# Patient Record
Sex: Female | Born: 1940
Health system: Southern US, Community
[De-identification: ages and names within clinical notes are randomized; demographics above are authoritative.]

## PROBLEM LIST (undated history)

## (undated) DIAGNOSIS — D649 Anemia, unspecified: Secondary | ICD-10-CM

## (undated) DIAGNOSIS — B019 Varicella without complication: Secondary | ICD-10-CM

## (undated) DIAGNOSIS — T7840XA Allergy, unspecified, initial encounter: Secondary | ICD-10-CM

## (undated) DIAGNOSIS — E079 Disorder of thyroid, unspecified: Secondary | ICD-10-CM

## (undated) DIAGNOSIS — I10 Essential (primary) hypertension: Secondary | ICD-10-CM

## (undated) HISTORY — DX: Disorder of thyroid, unspecified: E07.9

## (undated) HISTORY — DX: Allergy, unspecified, initial encounter: T78.40XA

## (undated) HISTORY — DX: Anemia, unspecified: D64.9

## (undated) HISTORY — DX: Essential (primary) hypertension: I10

## (undated) HISTORY — DX: Varicella without complication: B01.9

---

## 1998-06-08 ENCOUNTER — Emergency Department (HOSPITAL_COMMUNITY): Admission: EM | Admit: 1998-06-08 | Discharge: 1998-06-08 | Payer: Self-pay | Admitting: Emergency Medicine

## 2001-10-10 ENCOUNTER — Encounter: Payer: Self-pay | Admitting: Orthopedic Surgery

## 2001-10-10 ENCOUNTER — Encounter: Payer: Self-pay | Admitting: Emergency Medicine

## 2001-10-10 ENCOUNTER — Ambulatory Visit (HOSPITAL_COMMUNITY): Admission: EM | Admit: 2001-10-10 | Discharge: 2001-10-10 | Payer: Self-pay | Admitting: Emergency Medicine

## 2004-12-27 ENCOUNTER — Ambulatory Visit: Payer: Self-pay | Admitting: Internal Medicine

## 2005-05-02 ENCOUNTER — Ambulatory Visit: Payer: Self-pay | Admitting: Internal Medicine

## 2005-09-16 ENCOUNTER — Ambulatory Visit: Payer: Self-pay | Admitting: Internal Medicine

## 2005-09-21 ENCOUNTER — Ambulatory Visit: Payer: Self-pay | Admitting: Internal Medicine

## 2005-09-29 ENCOUNTER — Ambulatory Visit (HOSPITAL_COMMUNITY): Admission: RE | Admit: 2005-09-29 | Discharge: 2005-09-29 | Payer: Self-pay | Admitting: Internal Medicine

## 2005-10-11 ENCOUNTER — Other Ambulatory Visit: Admission: RE | Admit: 2005-10-11 | Discharge: 2005-10-11 | Payer: Self-pay | Admitting: Interventional Radiology

## 2005-10-11 ENCOUNTER — Encounter: Admission: RE | Admit: 2005-10-11 | Discharge: 2005-10-11 | Payer: Self-pay | Admitting: Internal Medicine

## 2006-08-21 ENCOUNTER — Ambulatory Visit: Payer: Self-pay | Admitting: Internal Medicine

## 2006-10-01 ENCOUNTER — Ambulatory Visit: Payer: Self-pay | Admitting: Internal Medicine

## 2006-10-08 ENCOUNTER — Ambulatory Visit: Payer: Self-pay | Admitting: Internal Medicine

## 2007-03-29 IMAGING — US US SOFT TISSUE HEAD/NECK
1 series · 14 of 25 positions shown · non-contrast
Comparison: none

CLINICAL DATA: Probable nodule left thyroid.
THYROID ULTRASOUND:
TECHNIQUE: Ultrasound examination of the thyroid gland and adjacent soft tissue structures was performed.

[Series 1: unknown · 0.07mm/px · 14 of 28 slices shown]
[im 1/28]
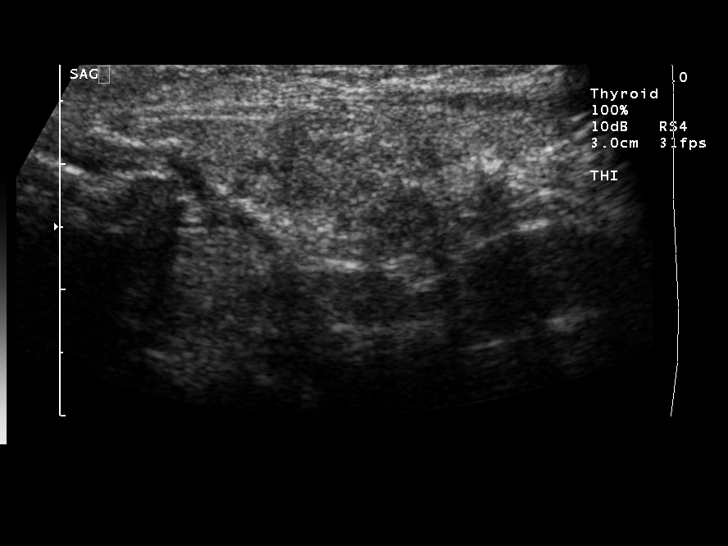
[im 3/28]
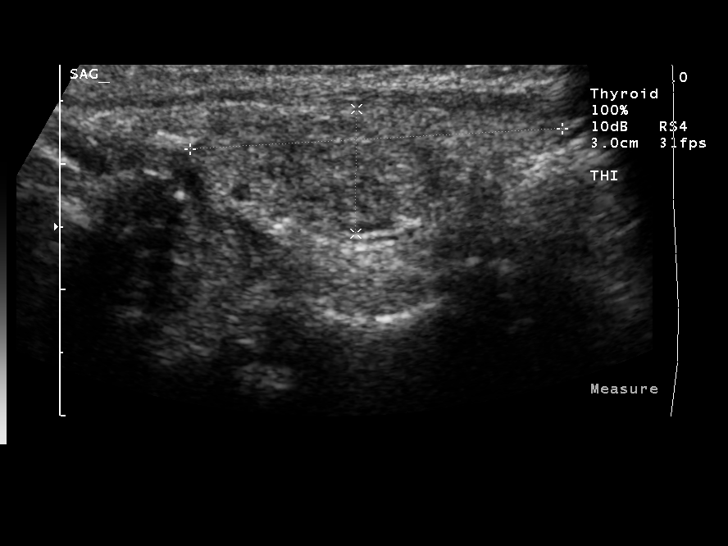
[im 5/28]
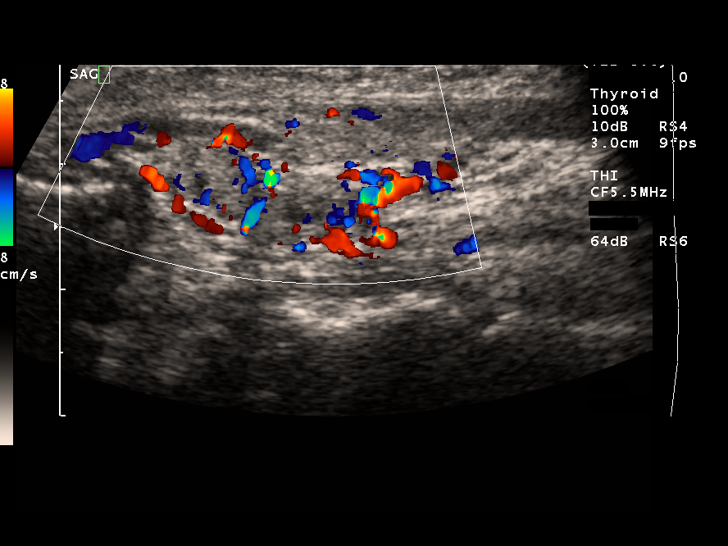
[im 7/28]
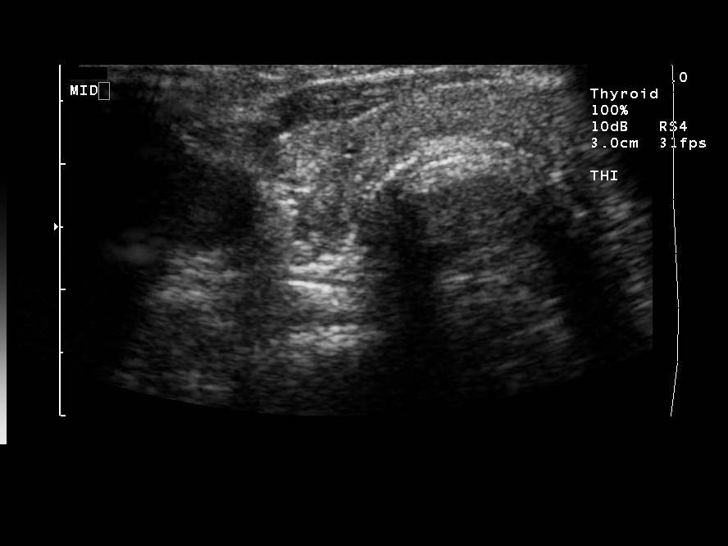
[im 10/28]
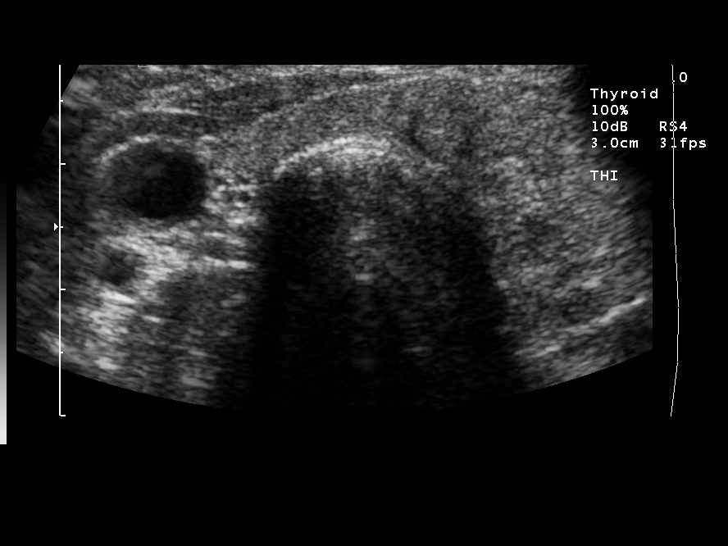
[im 11/28]
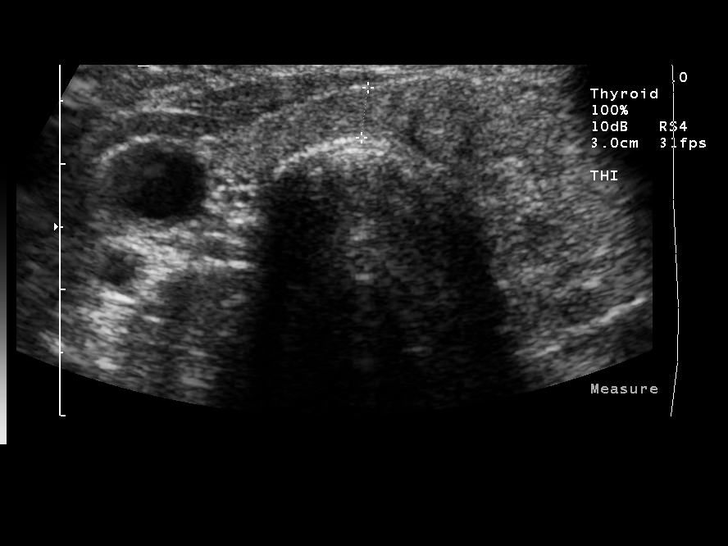
[im 13/28]
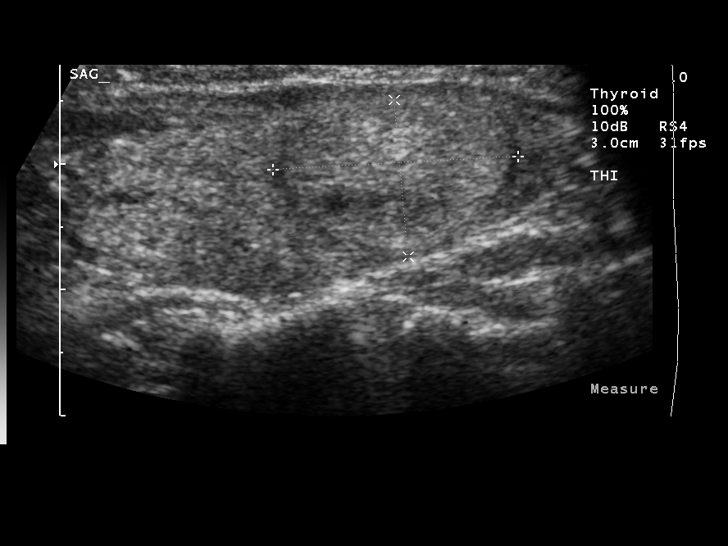
[im 15/28]
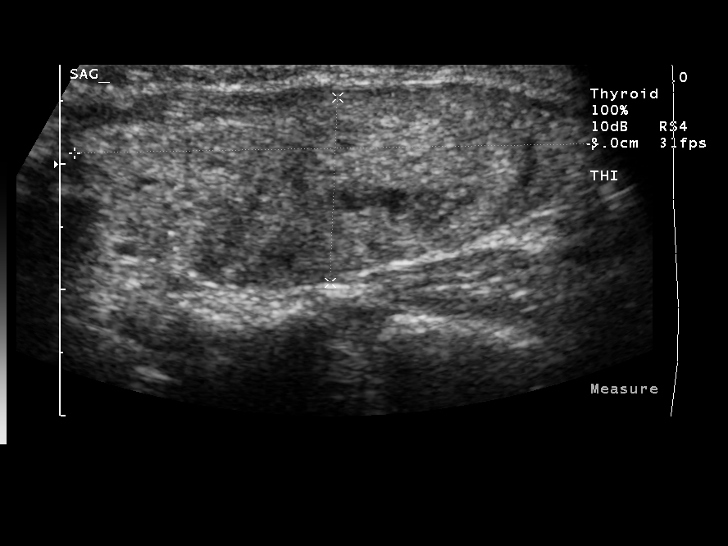
[im 17/28]
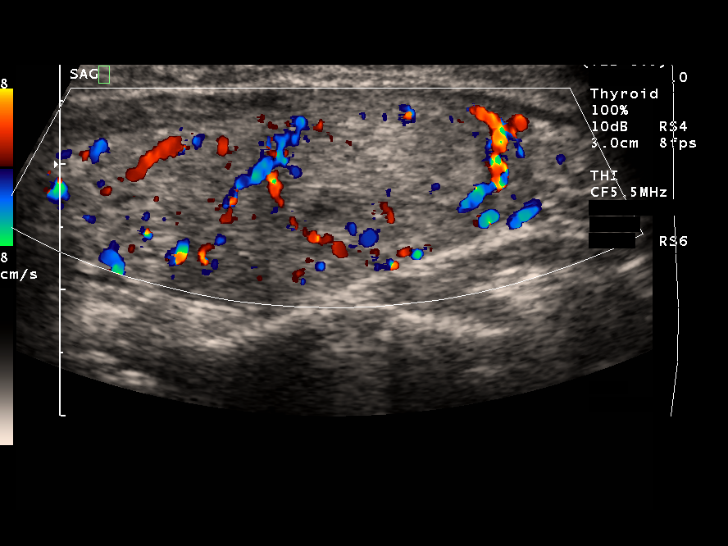
[im 19/28]
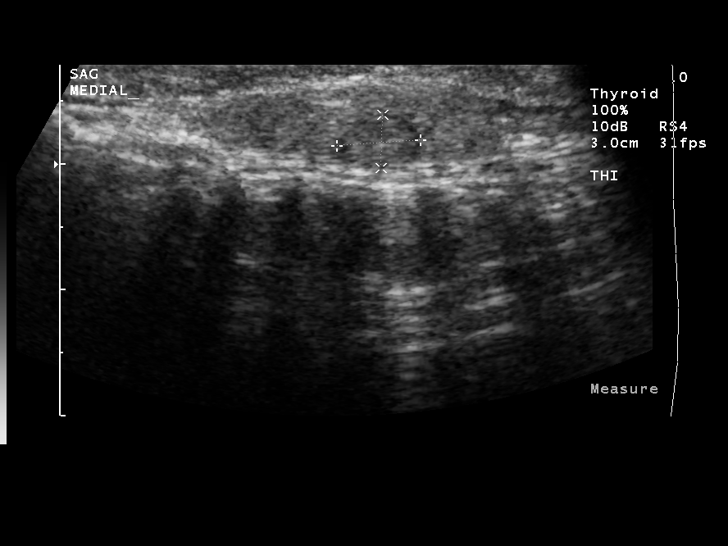
[im 21/28]
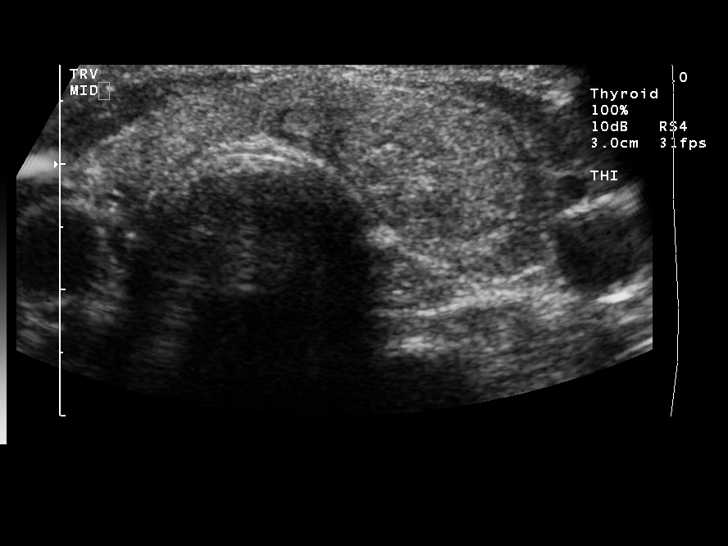
[im 23/28]
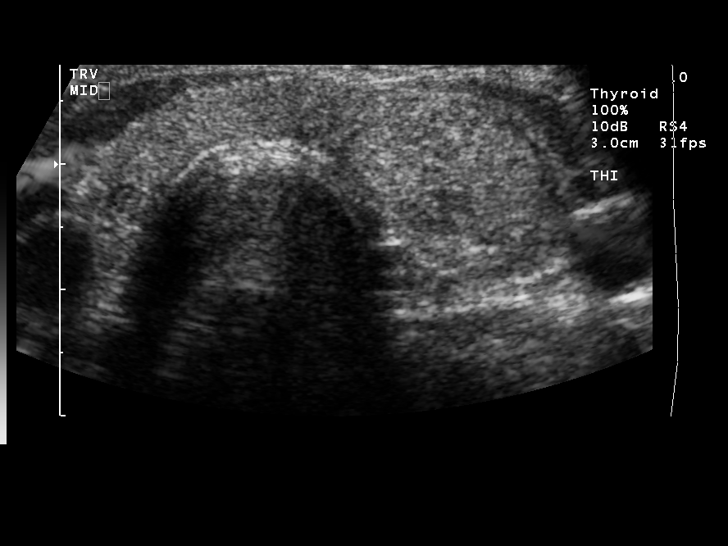
[im 25/28]
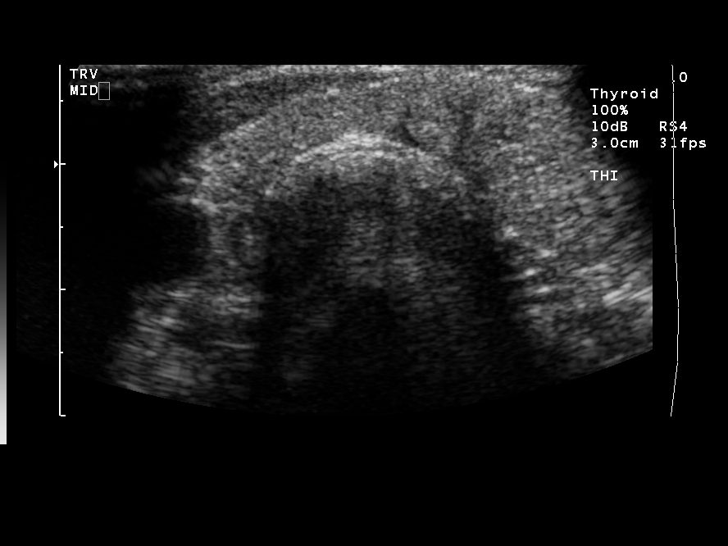
[im 28/28]
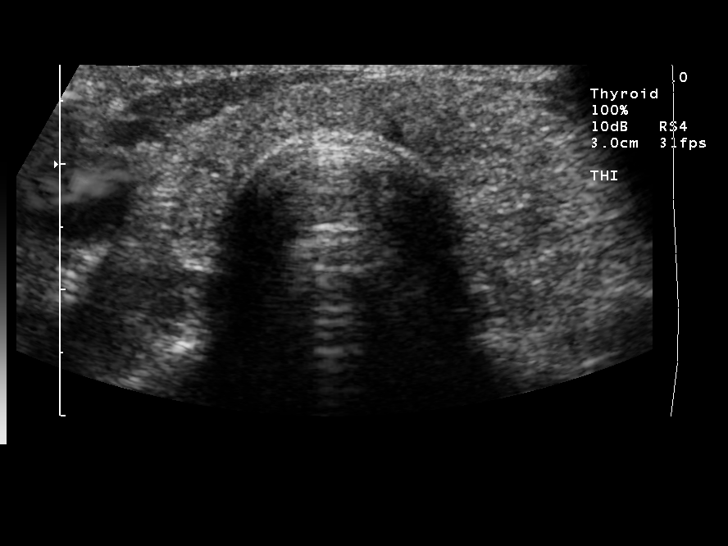

[14 of 25 positions shown; findings below may reference images not displayed]

FINDINGS: Multiple scans of the thyroid show right lobe of the thyroid to be within normal limits and measures 3.0 x 1.0 x 0.9 cm. left lobe measures 4.1 x 1.5 x 1.7 cm and contains a 4 x 5 x 7 mm nodule near the isthmus thyroid. The lower pole of the left lobe shows a 13 x 15 x 20 mm solid nodule.  The isthmus thyroid appears to be within normal limits of normal at 4 mm in thickness.
IMPRESSION: There are two nodules associated with the left thyroid as described above.  The larger is in the lower pole that measures 13 x 15 x 20 cm and appears to be solid.

## 2007-09-16 ENCOUNTER — Ambulatory Visit: Payer: Self-pay | Admitting: Internal Medicine

## 2009-08-03 ENCOUNTER — Encounter (INDEPENDENT_AMBULATORY_CARE_PROVIDER_SITE_OTHER): Payer: Self-pay

## 2009-09-03 ENCOUNTER — Ambulatory Visit: Payer: Self-pay | Admitting: Internal Medicine

## 2010-12-06 NOTE — Miscellaneous (Signed)
Summary: Flu Shot/[facility]  Flu Shot/[facility]   Imported By: Maryln Gottron 04/22/2010 11:06:17  _____________________________________________________________________  External Attachment:    Type:   Image     Comment:   External Document
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
## 2012-06-19 ENCOUNTER — Encounter: Payer: Self-pay | Admitting: Family Medicine

## 2012-06-19 ENCOUNTER — Ambulatory Visit (INDEPENDENT_AMBULATORY_CARE_PROVIDER_SITE_OTHER): Payer: Medicare Other | Admitting: Family Medicine

## 2012-06-19 VITALS — BP 100/74 | Temp 98.1°F | Ht 62.0 in | Wt 117.0 lb

## 2012-06-19 DIAGNOSIS — H9209 Otalgia, unspecified ear: Secondary | ICD-10-CM

## 2012-06-19 DIAGNOSIS — H6120 Impacted cerumen, unspecified ear: Secondary | ICD-10-CM

## 2012-06-19 NOTE — Progress Notes (Signed)
  Subjective:    Patient ID: Alejandra Alexander, female    DOB: July 23, 1941, 71 y.o.   MRN: [id number]  HPI Here for the first time in years complaining of pain in the right ear for 3 days. No other symptoms, no HA or ST or cough. She has tried Tylenol and Sudafed.    Review of Systems  Constitutional: Negative.   HENT: Positive for hearing loss and ear pain. Negative for congestion, postnasal drip, sinus pressure, tinnitus and ear discharge.   Eyes: Negative.   Respiratory: Negative.        Objective:   Physical Exam  Constitutional: She appears well-developed and well-nourished.  HENT:  Head: Normocephalic and atraumatic.  Left Ear: External ear normal.  Nose: Nose normal.  Mouth/Throat: Oropharynx is clear and moist.       Right ear canal is full of cerumen   Eyes: Conjunctivae are normal.  Neck: No thyromegaly present.  Cardiovascular: Normal rate, regular rhythm, normal heart sounds and intact distal pulses.   Pulmonary/Chest: Effort normal and breath sounds normal.  Lymphadenopathy:    She has no cervical adenopathy.          Assessment & Plan:  The cerumen was irrigated clear with water. On re-exam the canal and TM look normal. Recheck prn

## 2012-12-05 ENCOUNTER — Telehealth: Payer: Self-pay | Admitting: Internal Medicine

## 2012-12-05 NOTE — Telephone Encounter (Signed)
Tied to contact pt at both numbers they are wrong. Pt scheduled to come in for Shingles vaccine will let her know then she does not need another Pneumovax injection she is complete.

## 2012-12-05 NOTE — Telephone Encounter (Signed)
Pt would like to know if she is due her pneumonia vac? How long a they good for?

## 2012-12-12 ENCOUNTER — Ambulatory Visit: Payer: Medicare Other | Admitting: *Deleted

## 2012-12-19 ENCOUNTER — Ambulatory Visit: Payer: Medicare Other | Admitting: Internal Medicine

## 2013-09-01 ENCOUNTER — Other Ambulatory Visit: Payer: Self-pay | Admitting: Nurse Practitioner

## 2013-09-01 DIAGNOSIS — E039 Hypothyroidism, unspecified: Secondary | ICD-10-CM

## 2013-09-03 ENCOUNTER — Ambulatory Visit
Admission: RE | Admit: 2013-09-03 | Discharge: 2013-09-03 | Disposition: A | Discharge status: Home / Self Care | Payer: Medicare Other | Source: Ambulatory Visit | Attending: Nurse Practitioner | Admitting: Nurse Practitioner

## 2013-09-03 ENCOUNTER — Other Ambulatory Visit: Payer: Medicare Other

## 2013-09-03 DIAGNOSIS — E039 Hypothyroidism, unspecified: Secondary | ICD-10-CM

## 2013-09-24 ENCOUNTER — Ambulatory Visit: Payer: Medicare Other | Admitting: Endocrinology

## 2013-09-30 ENCOUNTER — Other Ambulatory Visit (HOSPITAL_COMMUNITY)
Admission: RE | Admit: 2013-09-30 | Discharge: 2013-09-30 | Disposition: A | Discharge status: Home / Self Care | Payer: Medicare Other | Source: Ambulatory Visit | Attending: Endocrinology | Admitting: Endocrinology

## 2013-09-30 ENCOUNTER — Ambulatory Visit (INDEPENDENT_AMBULATORY_CARE_PROVIDER_SITE_OTHER): Payer: Medicare Other | Admitting: Endocrinology

## 2013-09-30 ENCOUNTER — Encounter: Payer: Self-pay | Admitting: Endocrinology

## 2013-09-30 VITALS — BP 138/72 | HR 88 | Temp 97.8°F | Resp 16 | Ht 62.0 in | Wt 122.4 lb

## 2013-09-30 DIAGNOSIS — E041 Nontoxic single thyroid nodule: Secondary | ICD-10-CM | POA: Insufficient documentation

## 2013-09-30 DIAGNOSIS — J309 Allergic rhinitis, unspecified: Secondary | ICD-10-CM

## 2013-09-30 DIAGNOSIS — E039 Hypothyroidism, unspecified: Secondary | ICD-10-CM

## 2013-09-30 NOTE — Patient Instructions (Signed)
We'll contact you with biopsy results. If no cancer is seen, Please return in 1 year. Please continue the same thyroid medication.

## 2013-09-30 NOTE — Progress Notes (Signed)
  Subjective:    Patient ID: Alejandra Alexander, female    DOB: 08/30/1942, 72 y.o.   MRN: [id number]  HPI Pt was noted in 2006 to have a slight nodule at the left anterior neck, but no assoc pain.  She has been followed with serial [place] since then.  1 week ago, she was noted to have an elevated TSH, and started taking synthroid.   Past Medical History  Diagnosis Date  . Chickenpox   . Allergy     seasonal  . Hypertension   . Thyroid disease   . Anemia     No past surgical history on file.  History   Social History  . Marital Status: Married    Spouse Name: N/A    Number of Children: N/A  . Years of Education: N/A   Occupational History  . Not on file.   Social History Main Topics  . Smoking status: Never Smoker   . Smokeless tobacco: Never Used  . Alcohol Use: No  . Drug Use: No  . Sexual Activity: Not on file   Other Topics Concern  . Not on file   Social History Narrative  . No narrative on file    No current outpatient prescriptions on file prior to visit.   No current facility-administered medications on file prior to visit.    No Known Allergies  Family History  Problem Relation Age of Onset  . Heart disease Father   . Stroke Father   sister has a goiter BP 138/72  Pulse 88  Temp(Src) 97.8 F (36.6 C) (Oral)  Resp 16  Ht 5\' 2"  (1.575 m)  Wt 122 lb 6.4 oz (55.52 kg)  BMI 22.38 kg/m2  Review of Systems denies depression, hair loss, cramps, sob, weight gain, memory loss, constipation, numbness, blurry vision, myalgias, dry skin, rhinorrhea, easy bruising, and syncope.       Objective:   Physical Exam VS: see vs page GEN: no distress HEAD: head: no deformity eyes: no periorbital swelling, no proptosis external nose and ears are normal.   mouth: no lesion seen NECK: 3 cm left lobe thyroid nodule.   CHEST WALL: no deformity LUNGS:  Clear to auscultation CV: reg rate and rhythm, no murmur ABD: abdomen is soft, nontender.  no hepatosplenomegaly.   not distended.  no hernia MUSCULOSKELETAL: muscle bulk and strength are grossly normal.  no obvious joint swelling.  gait is normal and steady EXTEMITIES: no deformity.  no ulcer on the feet.  feet are of normal color and temp.  no edema PULSES: dorsalis pedis intact bilat.  no carotid bruit NEURO:  cn 2-12 grossly intact.   readily moves all 4's.  sensation is intact to touch on the feet SKIN:  Normal texture and temperature.  No rash or suspicious lesion is visible.   NODES:  None palpable at the neck PSYCH: alert, oriented x3.  Does not appear anxious nor depressed.     outside test results are reviewed: TSH=4.6   Procedure: thyroid needle bx: consent obtained, signed form on chart The area is first sprayed with cooling agent local: marcaine 2%, with epinephrine prep: alcohol pad 3 bxs are done with 27g needles no complications  (i also reviewed [place] report)     Assessment & Plan:  Multinodular goiter, slightly larger since 2006 Hypothyroidism, new, mild. Allergic rhinitis: this can cause sxs at the throat area, but is not thyroid-related.

## 2013-10-02 DIAGNOSIS — E079 Disorder of thyroid, unspecified: Secondary | ICD-10-CM | POA: Insufficient documentation

## 2013-10-02 DIAGNOSIS — J309 Allergic rhinitis, unspecified: Secondary | ICD-10-CM | POA: Insufficient documentation

## 2013-10-02 DIAGNOSIS — J302 Other seasonal allergic rhinitis: Secondary | ICD-10-CM | POA: Insufficient documentation

## 2014-09-08 ENCOUNTER — Encounter: Payer: Self-pay | Admitting: Endocrinology

## 2014-09-08 ENCOUNTER — Ambulatory Visit (INDEPENDENT_AMBULATORY_CARE_PROVIDER_SITE_OTHER): Payer: Medicare Other | Admitting: Endocrinology

## 2014-09-08 VITALS — BP 138/90 | HR 89 | Temp 98.0°F | Ht 62.0 in | Wt 119.0 lb

## 2014-09-08 DIAGNOSIS — E041 Nontoxic single thyroid nodule: Secondary | ICD-10-CM

## 2014-09-08 MED ORDER — LEVOTHYROXINE SODIUM 25 MCG PO TABS
25.0000 ug | ORAL_TABLET | Freq: Every day | ORAL | Status: DC
Start: 2014-09-08 — End: 2015-09-09

## 2014-09-08 NOTE — Progress Notes (Signed)
   Subjective:    Patient ID: Alejandra Alexander, female    DOB: 22-Mar-1941, 73 y.o.   MRN: [id number]  HPI History is from pt and dtr.  pt returns for f/u of multinodular goiter (first noted in 2006; she has been followed with serial US since then; in 2014, she had bx: result was non-neoplasic goiter (bx in 2006 was also benign); also in 2014, she was noted to have an elevated TSH, and started taking synthroid).  pt states she feels well in general.  She does not notice the goiter.  Past Medical History  Diagnosis Date  . Chickenpox   . Allergy     seasonal  . Hypertension   . Thyroid disease   . Anemia     No past surgical history on file.  History   Social History  . Marital Status: Married    Spouse Name: N/A    Number of Children: N/A  . Years of Education: N/A   Occupational History  . Not on file.   Social History Main Topics  . Smoking status: Never Smoker   . Smokeless tobacco: Never Used  . Alcohol Use: No  . Drug Use: No  . Sexual Activity: Not on file   Other Topics Concern  . Not on file   Social History Narrative    Current Outpatient Prescriptions on File Prior to Visit  Medication Sig Dispense Refill  . aspirin 81 MG tablet Take 81 mg by mouth daily.    . calcium-vitamin D (OSCAL WITH D) 500-200 MG-UNIT per tablet Take 1 tablet by mouth daily.    Marland Kitchen. loratadine (CLARITIN) 10 MG tablet Take 10 mg by mouth daily.     No current facility-administered medications on file prior to visit.    No Known Allergies  Family History  Problem Relation Age of Onset  . Heart disease Father   . Stroke Father     BP 138/90 mmHg  Pulse 89  Temp(Src) 98 F (36.7 C) (Oral)  Ht 5\' 2"  (1.575 m)  Wt 119 lb (53.978 kg)  BMI 21.76 kg/m2  SpO2 97%  Review of Systems Denies neck pain.    Objective:   Physical Exam VITAL SIGNS:  See vs page GENERAL: no distress NECK: 3 cm left lobe thyroid nodule.   Skin: not diaphoretic.  Neuro: no tremor.   MSK: gait is  normal and steady. Ext: no edema       Assessment & Plan:  multinodular goiter, clinically stable. Hypothyroidism, due for recheck.  Patient is advised the following: Patient Instructions  You can skip this year on the ultrasound.   Please return in 1 year. Please continue the same thyroid medication.

## 2014-09-08 NOTE — Patient Instructions (Addendum)
You can skip this year on the ultrasound.   Please return in 1 year. Please continue the same thyroid medication.

## 2014-12-07 LAB — HM MAMMOGRAPHY

## 2015-03-03 IMAGING — US US SOFT TISSUE HEAD/NECK
1 series · 13 of 25 positions shown · non-contrast
Comparison: Prior ultrasound report from 09/29/2005. Images not
available.

CLINICAL DATA: Hypothyroidism.

EXAM:
THYROID ULTRASOUND
TECHNIQUE: Ultrasound examination of the thyroid gland and adjacent soft
tissues was performed.

[Series 1: us soft tissue head/neck · 0.05mm/px · 13 of 51 slices shown]
[im 1/51]
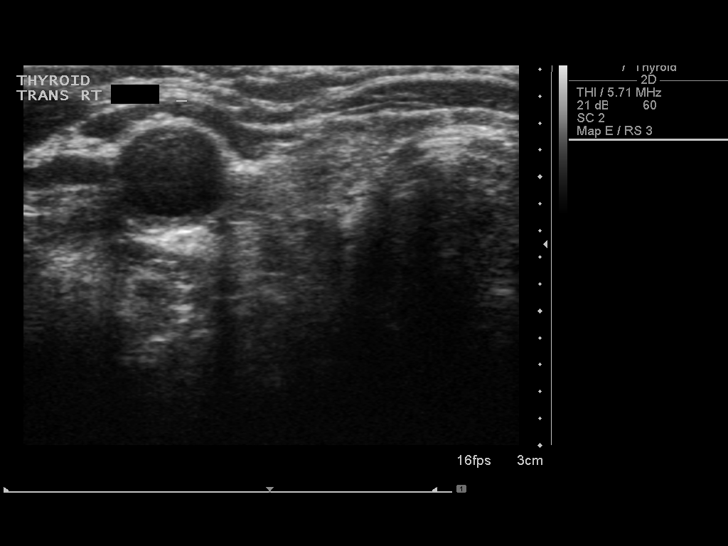
[im 5/51]
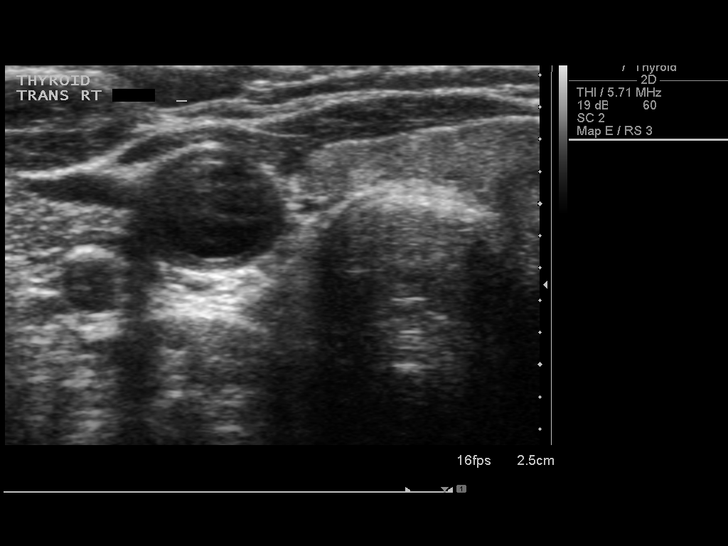
[im 9/51]
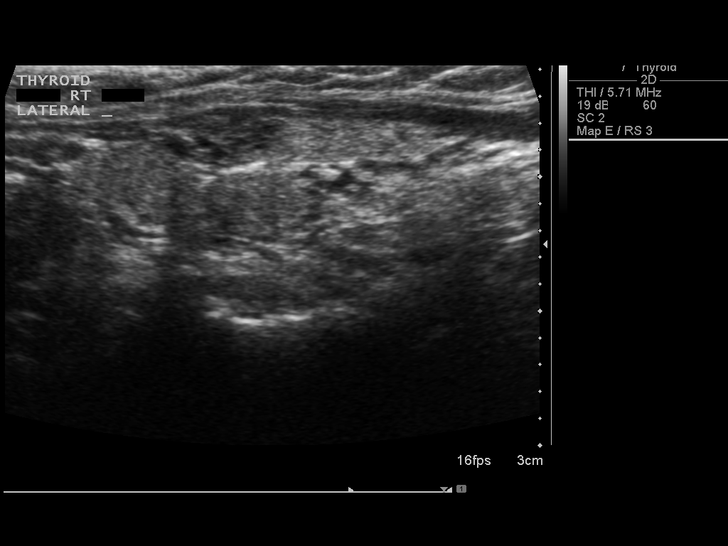
[im 13/51]
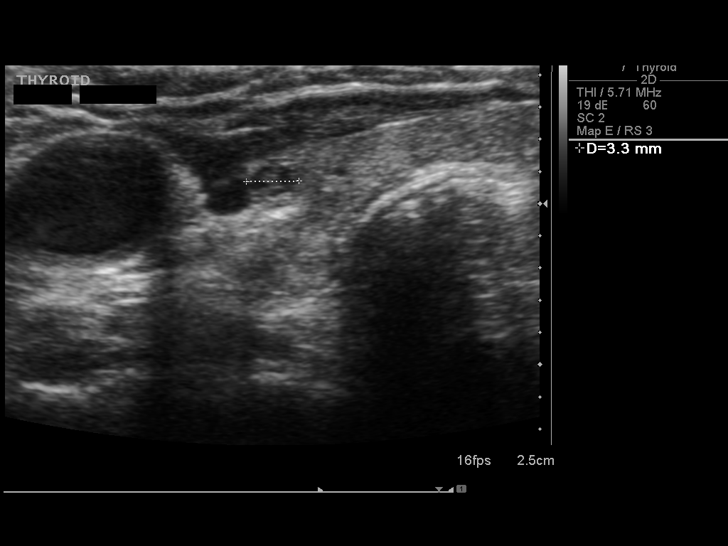
[im 17/51]
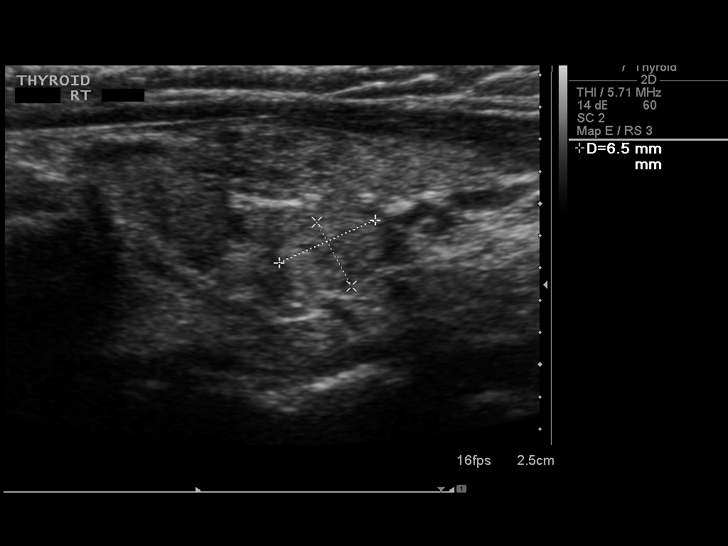
[im 21/51]
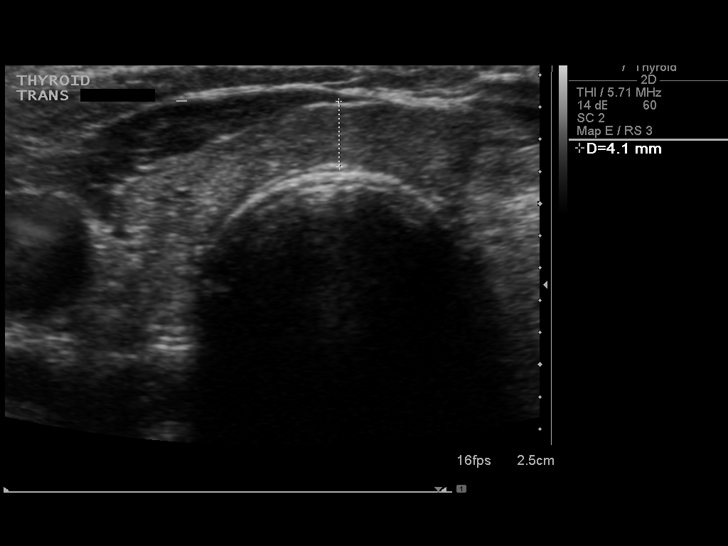
[im 26/51]
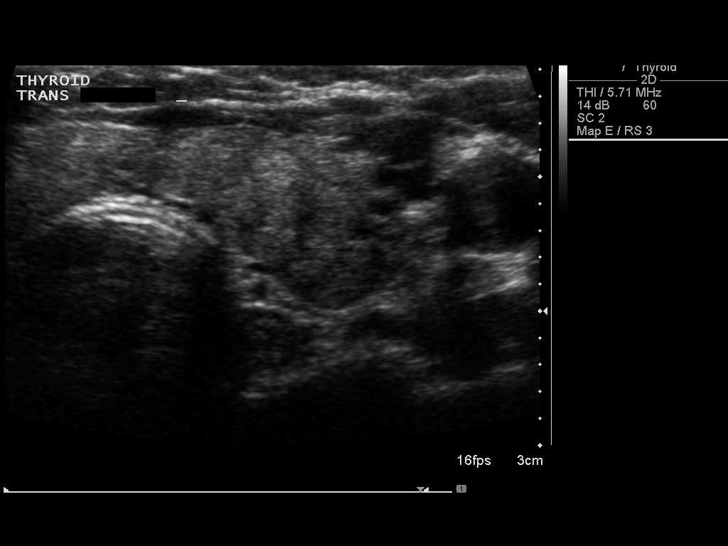
[im 30/51]
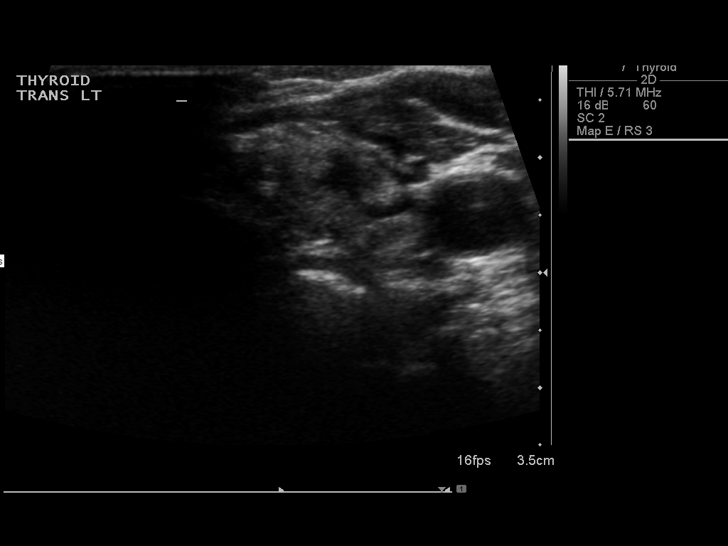
[im 34/51]
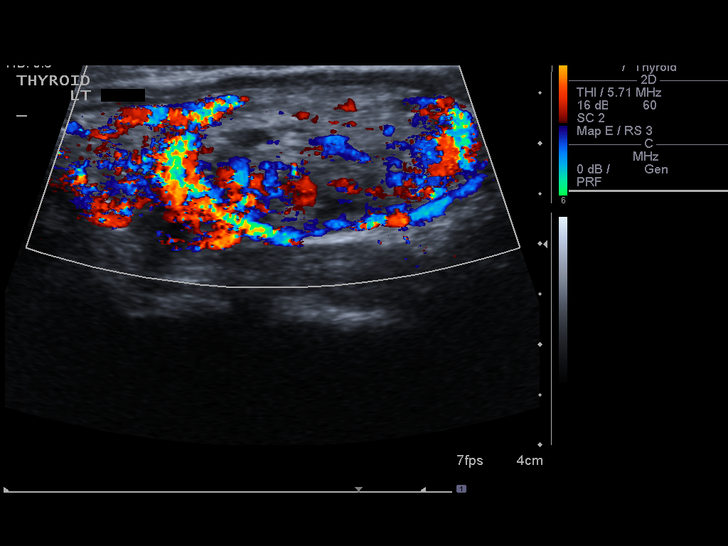
[im 38/51]
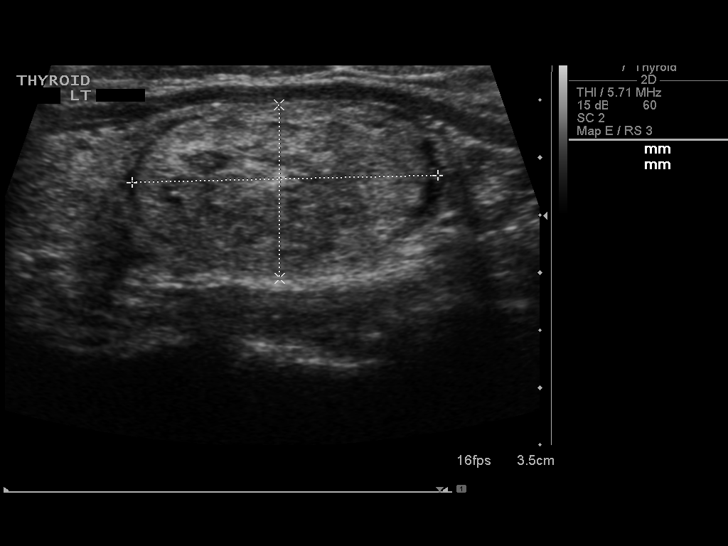
[im 42/51]
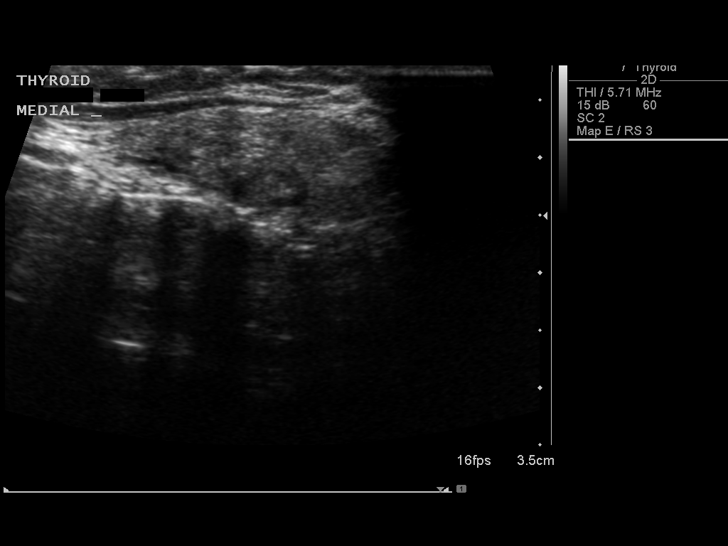
[im 46/51]
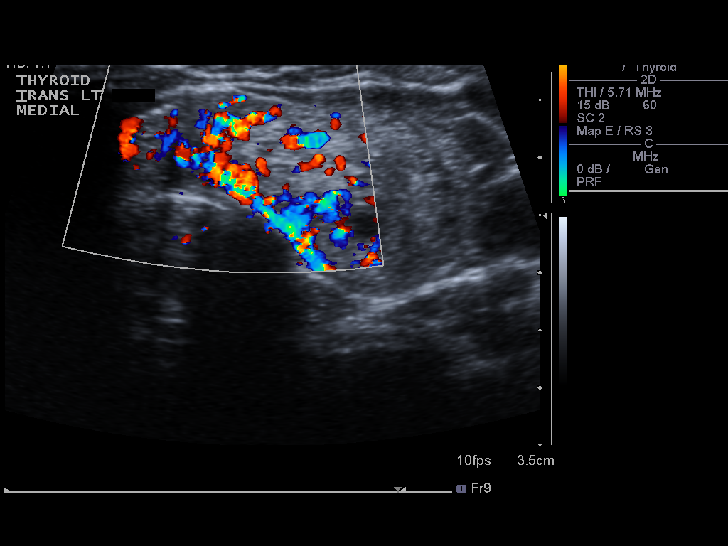
[im 51/51]
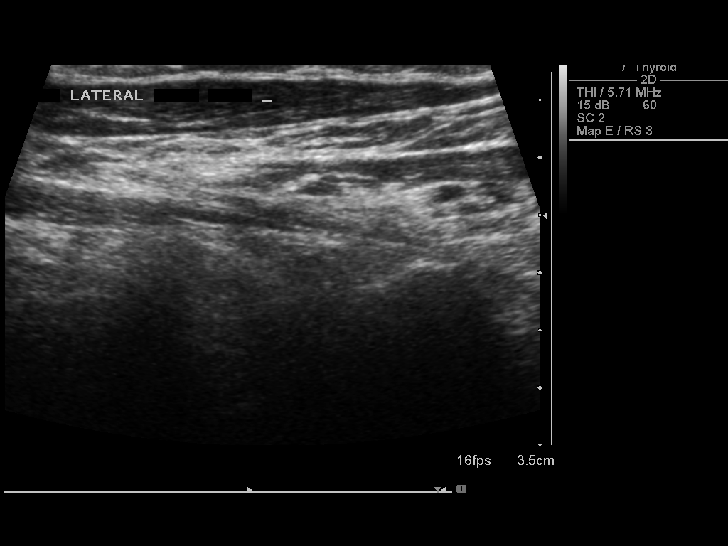

[13 of 25 positions shown; findings below may reference images not displayed]

FINDINGS: Right thyroid lobe

Measurements: 3.0 x 1.2 x 0.9 cm.. The right lower lobe is very
vascular. There is a small complex nodule in the mid aspect that
measures 0.6 x 0.4 x 0.3 cm. There is a slightly hyperechoic solid
nodule in the inferior right thyroid lobe that measures 0.7 x 0.5 x
0.5 cm.

Left thyroid lobe

Measurements: 4.9 x 1.6 x 2.2 cm.. There is a dominant lesion with
peripheral vascularity in the mid and inferior left thyroid lobe.
This dominant nodule measures 2.7 x 1.5 x 2.1 cm. Reportedly, this
dominant nodule previously measured 2.0 x 1.3 x 1.5 cm. There is a
second solid nodule in the left thyroid lobe along the medial
aspect. This nodule measures 0.8 x 0.4 x 0.5 cm and previously
measured 0.7 x 0.4 x 0.5 cm.

Isthmus

Thickness: 0.4 cm.  No nodules visualized.

Lymphadenopathy

None visualized.
IMPRESSION: Bilateral thyroid nodules. The dominant nodule in the left thyroid
lobe has slightly enlarged according to the report from 1886. This
nodule would meet the criteria for ultrasound-guided biopsy. This
recommendation follows the consensus statement: Management of
Thyroid Nodules Detected at US: Society of Radiologists in
Ultrasound Consensus Conference Statement. Radiology 9992;
[DATE].

New small nodules in the right thyroid lobe.

## 2015-05-03 ENCOUNTER — Other Ambulatory Visit: Payer: Self-pay

## 2015-06-09 ENCOUNTER — Encounter: Payer: Self-pay | Admitting: *Deleted

## 2015-09-09 ENCOUNTER — Ambulatory Visit (INDEPENDENT_AMBULATORY_CARE_PROVIDER_SITE_OTHER): Payer: Medicare Other | Admitting: Endocrinology

## 2015-09-09 ENCOUNTER — Encounter: Payer: Self-pay | Admitting: Endocrinology

## 2015-09-09 VITALS — BP 133/86 | HR 76 | Temp 97.9°F | Ht 62.0 in | Wt 109.0 lb

## 2015-09-09 DIAGNOSIS — E041 Nontoxic single thyroid nodule: Secondary | ICD-10-CM | POA: Diagnosis not present

## 2015-09-09 DIAGNOSIS — Z23 Encounter for immunization: Secondary | ICD-10-CM

## 2015-09-09 MED ORDER — LEVOTHYROXINE SODIUM 25 MCG PO TABS: 25.0000 ug | ORAL_TABLET | Freq: Every day | ORAL | Status: DC

## 2015-09-09 NOTE — Progress Notes (Signed)
   Subjective:    Patient ID: Alejandra Alexander, female    DOB: 1941-04-26, 74 y.o.   MRN: [id number]  HPI pt returns for f/u of multinodular goiter (first noted in 2006; she has been followed with serial US since then; in 2014, she had bx: result was non-neoplasic goiter (bx in 2006 was also benign); also in 2014, she was noted to have an elevated TSH, and started taking synthroid).  pt states she feels well in general.  She does not notice the goiter.   Past Medical History  Diagnosis Date  . Chickenpox   . Allergy     seasonal  . Hypertension   . Thyroid disease   . Anemia     No past surgical history on file.  Social History   Social History  . Marital Status: Married    Spouse Name: N/A  . Number of Children: N/A  . Years of Education: N/A   Occupational History  . Not on file.   Social History Main Topics  . Smoking status: Never Smoker   . Smokeless tobacco: Never Used  . Alcohol Use: No  . Drug Use: No  . Sexual Activity: Not on file   Other Topics Concern  . Not on file   Social History Narrative    Current Outpatient Prescriptions on File Prior to Visit  Medication Sig Dispense Refill  . loratadine (CLARITIN) 10 MG tablet Take 10 mg by mouth daily.    Marland Kitchen. aspirin 81 MG tablet Take 81 mg by mouth daily.    . calcium-vitamin D (OSCAL WITH D) 500-200 MG-UNIT per tablet Take 1 tablet by mouth daily.     No current facility-administered medications on file prior to visit.    No Known Allergies  Family History  Problem Relation Age of Onset  . Heart disease Father   . Stroke Father     BP 133/86 mmHg  Pulse 76  Temp(Src) 97.9 F (36.6 C) (Oral)  Ht 5\' 2"  (1.575 m)  Wt 109 lb (49.442 kg)  BMI 19.93 kg/m2  SpO2 98%    Review of Systems Denies dysphagia and sob.      Objective:   Physical Exam VITAL SIGNS:  See vs page GENERAL: no distress NECK: 3 cm left lobe thyroid nodule is again noted.   TSH=1.57    Assessment & Plan:  multinodular  goiter, due for f/u US Hypothyroidism: well-replaced  Patient is advised the following: Patient Instructions  Let's recheck the ultrasound.  you will receive a phone call, about a day and time for an appointment. Please continue the same levothyroxine.   Please return in 2 years.

## 2015-09-09 NOTE — Patient Instructions (Addendum)
Let's recheck the ultrasound.  you will receive a phone call, about a day and time for an appointment. Please continue the same levothyroxine.   Please return in 2 years.

## 2015-09-20 ENCOUNTER — Ambulatory Visit
Admission: RE | Admit: 2015-09-20 | Discharge: 2015-09-20 | Disposition: A | Discharge status: Home / Self Care | Payer: Medicare Other | Source: Ambulatory Visit | Attending: Endocrinology | Admitting: Endocrinology

## 2015-09-20 DIAGNOSIS — E041 Nontoxic single thyroid nodule: Secondary | ICD-10-CM

## 2015-09-21 ENCOUNTER — Telehealth: Payer: Self-pay | Admitting: Endocrinology

## 2015-09-21 NOTE — Telephone Encounter (Signed)
See note below to be advised. 

## 2015-09-21 NOTE — Telephone Encounter (Signed)
I contacted the pt and advise of note below. Requested a call back if the pt would like to discuss.  

## 2015-09-21 NOTE — Telephone Encounter (Signed)
Patient stated that Dr Everardo AllEllison told her to call and let him know if her thyroid test came back normal, she said her thyroid levels are normal.

## 2015-09-21 NOTE — Telephone Encounter (Signed)
please call patient: Your blood test is normal, and US is unchanged--good No further testing or treatment is needed now. Please return in 2 years.

## 2016-09-01 ENCOUNTER — Telehealth: Payer: Self-pay | Admitting: Endocrinology

## 2016-09-01 MED ORDER — LEVOTHYROXINE SODIUM 25 MCG PO TABS: 25.0000 ug | ORAL_TABLET | Freq: Every day | ORAL | 3 refills | Status: DC

## 2016-09-01 NOTE — Telephone Encounter (Signed)
Patient need a refill of levothyroxine (SYNTHROID, LEVOTHROID) 25 MCG tablet  [address] [place] [address] [phone] (Phone) [phone] (Fax)

## 2016-09-01 NOTE — Telephone Encounter (Signed)
Refill submitted per patient's request.  

## 2016-09-08 ENCOUNTER — Ambulatory Visit: Payer: Self-pay | Admitting: Endocrinology

## 2017-03-19 IMAGING — US US SOFT TISSUE HEAD/NECK
1 series · 14 of 25 positions shown · non-contrast
Comparison: 09/03/2013

CLINICAL DATA: Follow-up left thyroid nodule

EXAM:
THYROID ULTRASOUND
TECHNIQUE: Ultrasound examination of the thyroid gland and adjacent soft
tissues was performed.

[Series 1: us soft tissue head/neck · 0.05mm/px · 14 of 57 slices shown]
[im 1/57]
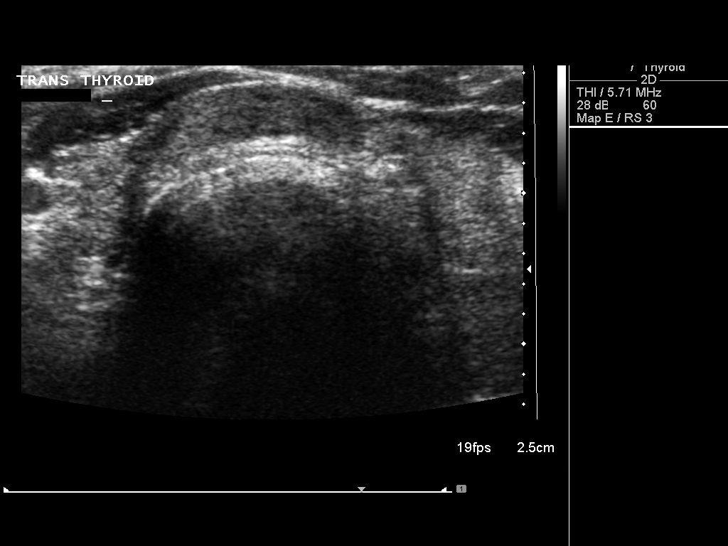
[im 5/57]
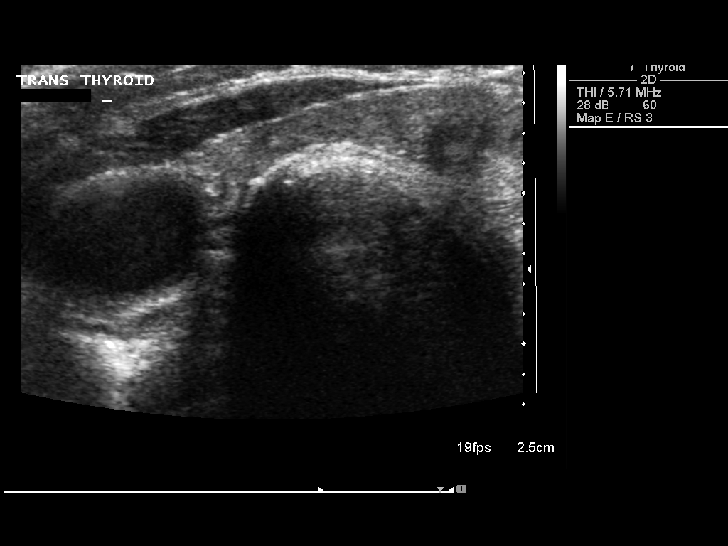
[im 10/57]
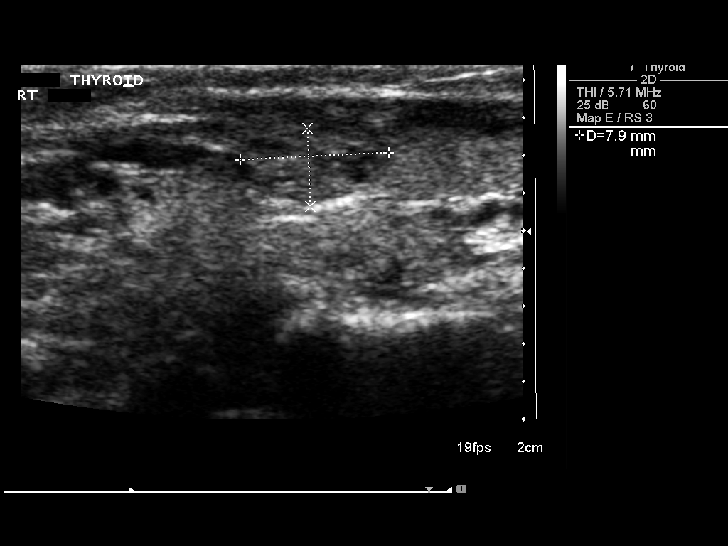
[im 15/57]
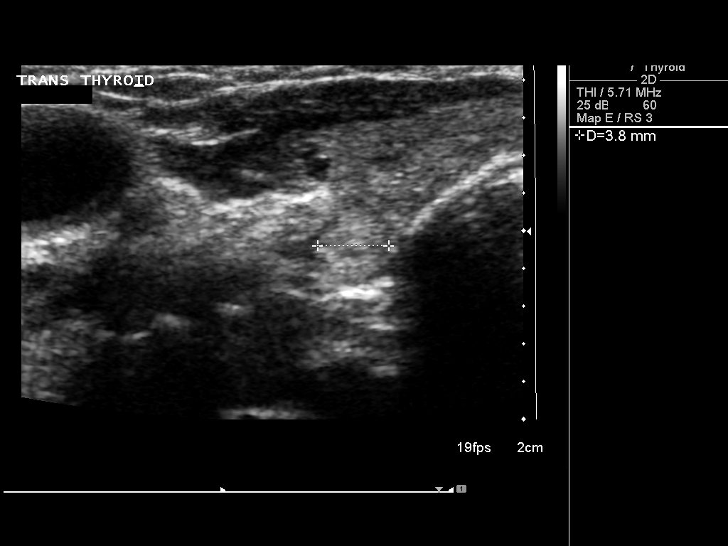
[im 19/57]
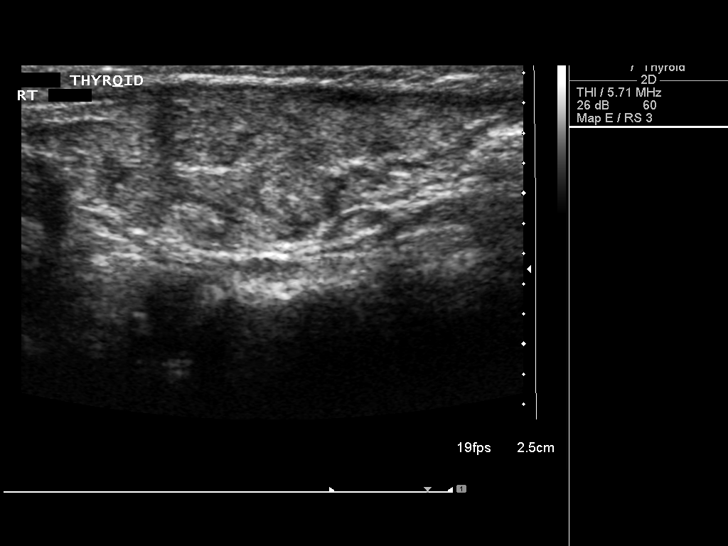
[im 22/57]
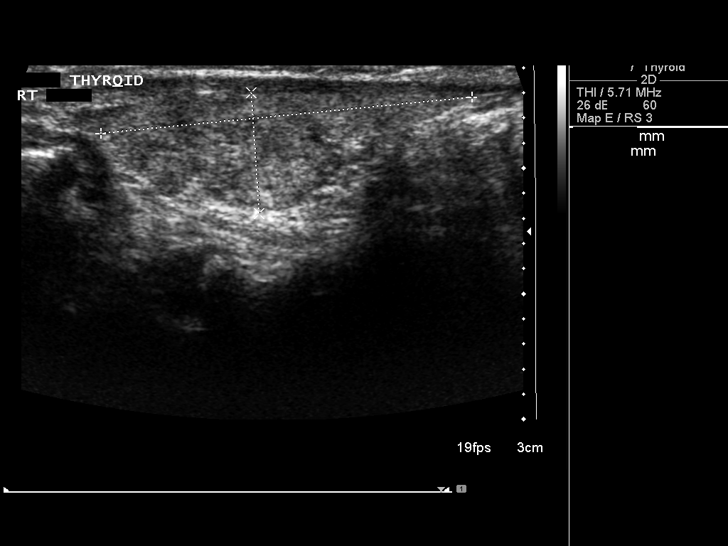
[im 26/57]
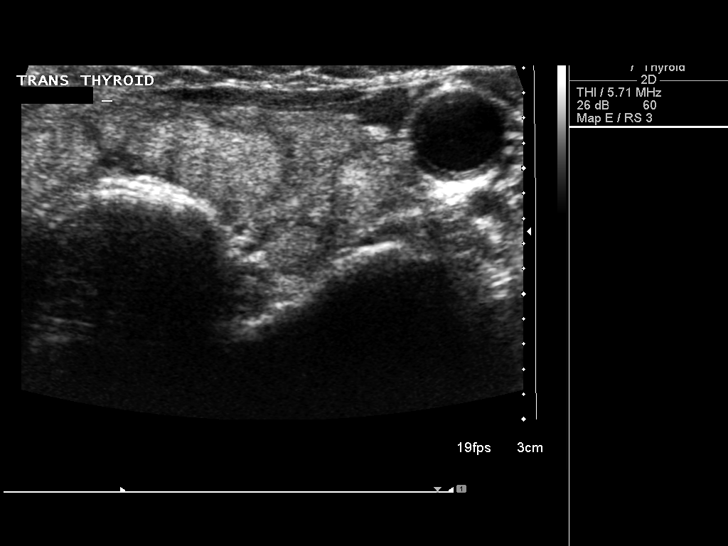
[im 31/57]
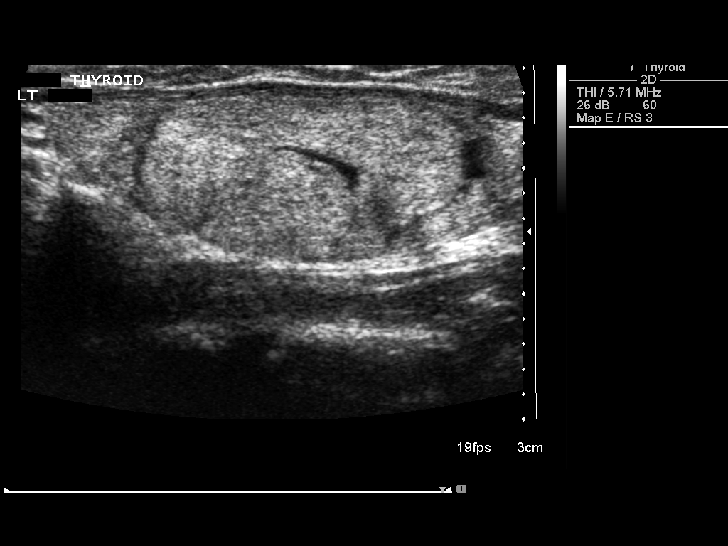
[im 36/57]
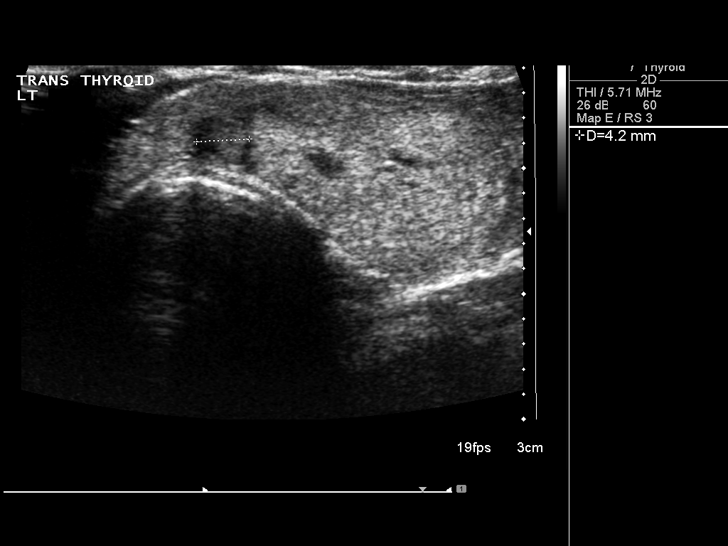
[im 38/57]
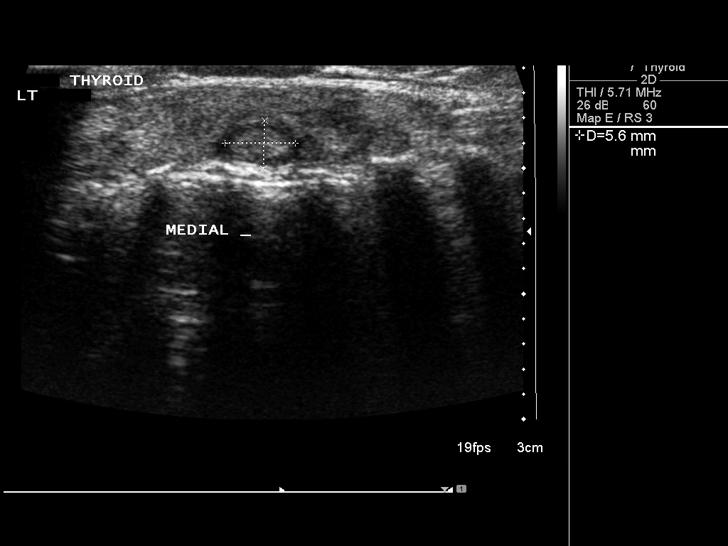
[im 43/57]
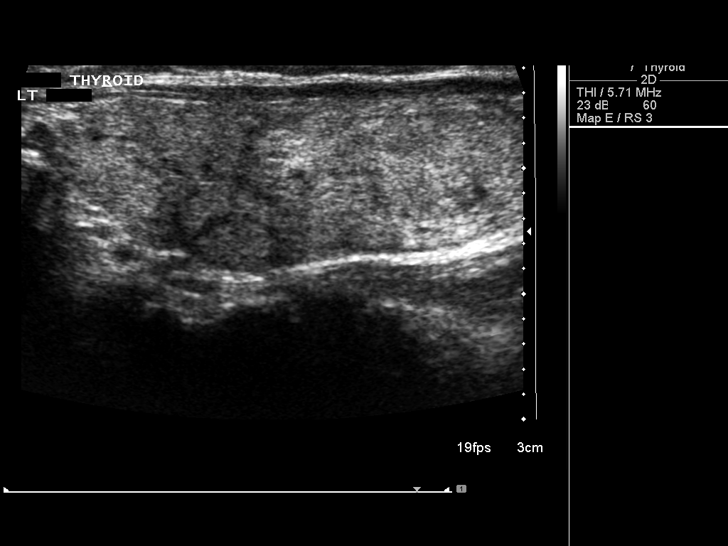
[im 47/57]
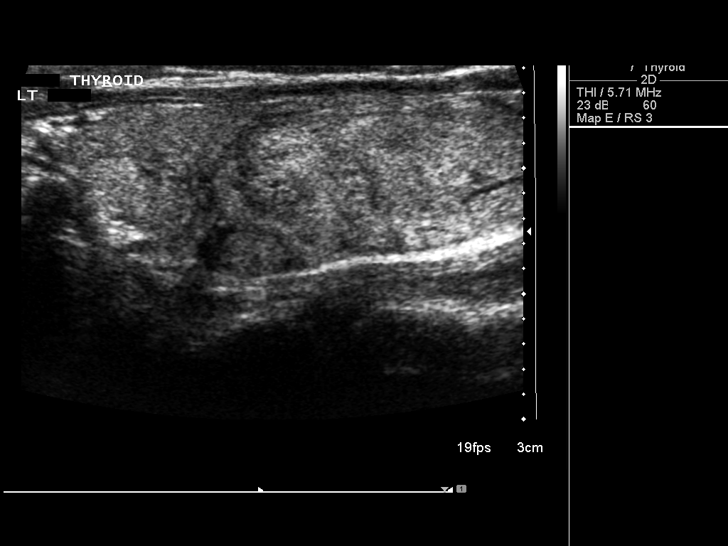
[im 52/57]
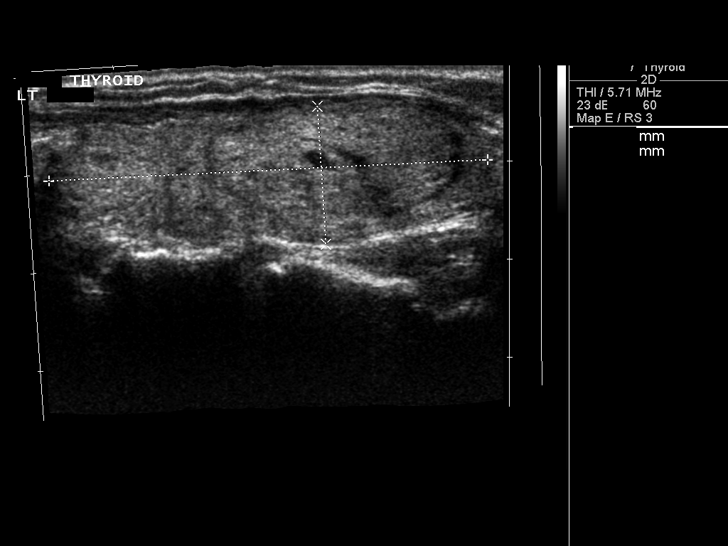
[im 57/57]
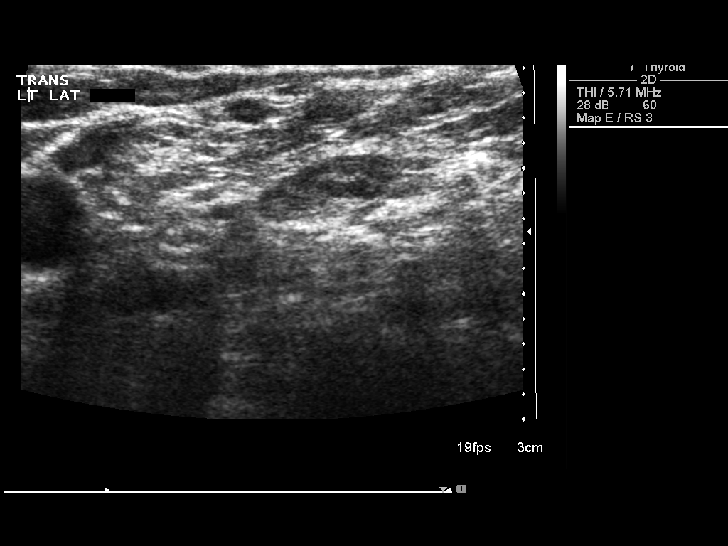

[14 of 25 positions shown; findings below may reference images not displayed]

FINDINGS: Right thyroid lobe

Measurements: 3.0 x 1.0 x 1.2 cm. 8 mm complex but predominately
solid upper pole nodule previously measured 6 mm. 6 mm hyperechoic
lower pole nodule versus pseudo nodule previously measured 7 mm.

Left thyroid lobe

Measurements: 4.5 x 1.4 x 2.0 cm. Dominant predominately solid but
complex nodule in the mid lobe measures 2.6 x 1.3 x 2.1 cm and
previously measured 2.7 x 1.5 x 2.1 cm. Solid 6 mm upper pole
nodule. Medial 6 mm solid nodule previously measured 8 mm.

Isthmus

Thickness: 5 mm.  No nodules visualized.

Lymphadenopathy

None visualized.
IMPRESSION: Bilateral nodules are not significantly changed. The dominant nodule
today measures 2.6 cm and previously measured 2.7 cm.

## 2017-04-04 ENCOUNTER — Telehealth: Payer: Self-pay | Admitting: Endocrinology

## 2017-04-04 NOTE — Telephone Encounter (Signed)
Patient is calling to ask if it is safe to drink unsweetened soy milk with her condition. Verified home #. Ok to leave vm

## 2017-04-04 NOTE — Telephone Encounter (Signed)
Left detailed vm and requested a call back if she had any further questions 

## 2017-04-04 NOTE — Telephone Encounter (Signed)
Yes, fine

## 2017-07-26 ENCOUNTER — Encounter: Payer: Self-pay | Admitting: Internal Medicine

## 2017-08-21 ENCOUNTER — Telehealth: Payer: Self-pay | Admitting: *Deleted

## 2017-08-21 ENCOUNTER — Encounter: Payer: Self-pay | Admitting: Endocrinology

## 2017-08-21 NOTE — Telephone Encounter (Signed)
Patient called with questions regarding the 2nd shingles shot. Please advise

## 2017-08-23 NOTE — Telephone Encounter (Signed)
Called pt, no answer  Will call back at a later time.

## 2017-09-07 ENCOUNTER — Encounter: Payer: Self-pay | Admitting: Endocrinology

## 2017-09-07 ENCOUNTER — Ambulatory Visit (INDEPENDENT_AMBULATORY_CARE_PROVIDER_SITE_OTHER): Payer: Medicare Other | Admitting: Endocrinology

## 2017-09-07 VITALS — BP 142/78 | HR 74 | Wt 116.4 lb

## 2017-09-07 DIAGNOSIS — E041 Nontoxic single thyroid nodule: Secondary | ICD-10-CM

## 2017-09-07 NOTE — Patient Instructions (Addendum)
We can skip the ultrasound this time.  Please continue the same levothyroxine.   Please return in 2 years.

## 2017-09-07 NOTE — Progress Notes (Signed)
   Subjective:    Patient ID: Alejandra Alexander, female    DOB: June 21, 1941, 76 y.o.   MRN: [id number]  HPI pt returns for f/u of multinodular goiter (first noted in 2006; she has been followed with serial US since then; in 2014, she had bx: result was non-neoplasic goiter (bx in 2006 was also benign); also in 2014, she was noted to have an elevated TSH, and started taking synthroid).  pt states she feels well in general.  She does not notice the goiter.  Past Medical History:  Diagnosis Date  . Allergy    seasonal  . Anemia   . Chickenpox   . Hypertension   . Thyroid disease     No past surgical history on file.  Social History   Social History  . Marital status: Widowed    Spouse name: N/A  . Number of children: N/A  . Years of education: N/A   Occupational History  . Not on file.   Social History Main Topics  . Smoking status: Never Smoker  . Smokeless tobacco: Never Used  . Alcohol use No  . Drug use: No  . Sexual activity: Not on file   Other Topics Concern  . Not on file   Social History Narrative  . No narrative on file    Current Outpatient Prescriptions on File Prior to Visit  Medication Sig Dispense Refill  . aspirin 81 MG tablet Take 81 mg by mouth daily.    . calcium-vitamin D (OSCAL WITH D) 500-200 MG-UNIT per tablet Take 1 tablet by mouth daily.    Marland Kitchen. levothyroxine (SYNTHROID, LEVOTHROID) 25 MCG tablet Take 1 tablet (25 mcg total) by mouth daily before breakfast. 90 tablet 3  . loratadine (CLARITIN) 10 MG tablet Take 10 mg by mouth daily.     No current facility-administered medications on file prior to visit.     No Known Allergies  Family History  Problem Relation Age of Onset  . Heart disease Father   . Stroke Father     BP (!) 142/78   Pulse 74   Wt 116 lb 6.4 oz (52.8 kg)   SpO2 98%   BMI 21.29 kg/m    Review of Systems Denies neck pain    Objective:   Physical Exam VITAL SIGNS:  See vs page GENERAL: no distress NECK: 3 cm  left lobe thyroid nodule is again noted.   outside test results are reviewed: TSH=3    Assessment & Plan:  Hypothyroidism: well-replaced.  Multinodular goiter: clinically stable.   Patient Instructions  We can skip the ultrasound this time.  Please continue the same levothyroxine.   Please return in 2 years.

## 2018-02-27 ENCOUNTER — Other Ambulatory Visit: Payer: Self-pay | Admitting: *Deleted

## 2018-02-27 MED ORDER — LEVOTHYROXINE SODIUM 25 MCG PO TABS: 25.0000 ug | ORAL_TABLET | Freq: Every day | ORAL | 3 refills | Status: DC

## 2018-05-16 ENCOUNTER — Encounter: Payer: Self-pay | Admitting: Adult Health

## 2018-05-16 ENCOUNTER — Ambulatory Visit: Payer: Medicare Other | Admitting: Adult Health

## 2018-05-16 VITALS — BP 158/63 | HR 80 | Ht 62.0 in | Wt 123.2 lb

## 2018-05-16 DIAGNOSIS — Z0000 Encounter for general adult medical examination without abnormal findings: Secondary | ICD-10-CM | POA: Diagnosis not present

## 2018-05-16 DIAGNOSIS — I10 Essential (primary) hypertension: Secondary | ICD-10-CM

## 2018-05-16 DIAGNOSIS — E079 Disorder of thyroid, unspecified: Secondary | ICD-10-CM

## 2018-05-16 DIAGNOSIS — J309 Allergic rhinitis, unspecified: Secondary | ICD-10-CM | POA: Diagnosis not present

## 2018-05-16 MED ORDER — MONTELUKAST SODIUM 10 MG PO TABS: 10.0000 mg | ORAL_TABLET | Freq: Every day | ORAL | 3 refills | Status: DC

## 2018-05-16 MED ORDER — LISINOPRIL 5 MG PO TABS: 5.0000 mg | ORAL_TABLET | Freq: Every day | ORAL | 0 refills | Status: DC

## 2018-05-16 MED ORDER — LEVOTHYROXINE SODIUM 25 MCG PO TABS: 25.0000 ug | ORAL_TABLET | Freq: Every day | ORAL | 3 refills | Status: DC

## 2018-05-16 NOTE — Progress Notes (Signed)
Subjective:    Patient ID: Alejandra Alexander, female    DOB: 01-13-1941, 77 y.o.   MRN: [id number]  HPI :  [name] is here to establish as a new pt.  She is a pleasant 77 year old female. PMH:  HTN, Hypothyroidism, Seasonal Allergies, and elevated blood pressure. She reports walking > 4 miles/daily and follows a heart healthy diet. She drinks a combination of water and green tea. She denies tobacco/ETOH use She is widowed and has a large local support system of friends and an adult daughter in [place], [place]  Patient Care Team    Relationship Specialty Notifications Start End  Gordy SaversKwiatkowski, Peter F, MD PCP - General   10/19/10     Patient Active Problem List   Diagnosis Date Noted  . Hypertension 05/16/2018  . Thyroid disease 10/02/2013  . Allergic rhinitis, cause unspecified 10/02/2013  . Left thyroid nodule 09/30/2013     Past Medical History:  Diagnosis Date  . Allergy    seasonal  . Anemia   . Chickenpox   . Hypertension   . Thyroid disease      History reviewed. No pertinent surgical history.   Family History  Problem Relation Age of Onset  . Heart disease Father   . Stroke Father      Social History   Substance and Sexual Activity  Drug Use No     Social History   Substance and Sexual Activity  Alcohol Use No     Social History   Tobacco Use  Smoking Status Never Smoker  Smokeless Tobacco Never Used     Outpatient Encounter Medications as of 05/16/2018  Medication Sig  . levothyroxine (SYNTHROID, LEVOTHROID) 25 MCG tablet Take 1 tablet (25 mcg total) by mouth daily before breakfast.  . [DISCONTINUED] cetirizine (ZYRTEC) 10 MG tablet Take 10 mg by mouth daily.  . [DISCONTINUED] levothyroxine (SYNTHROID, LEVOTHROID) 25 MCG tablet Take 1 tablet (25 mcg total) by mouth daily before breakfast.  . lisinopril (PRINIVIL,ZESTRIL) 5 MG tablet Take 1 tablet (5 mg total) by mouth daily.  . montelukast (SINGULAIR) 10 MG tablet Take 1 tablet (10 mg total)  by mouth at bedtime.  . [DISCONTINUED] aspirin 81 MG tablet Take 81 mg by mouth daily.  . [DISCONTINUED] calcium-vitamin D (OSCAL WITH D) 500-200 MG-UNIT per tablet Take 1 tablet by mouth daily.  . [DISCONTINUED] loratadine (CLARITIN) 10 MG tablet Take 10 mg by mouth daily.   No facility-administered encounter medications on file as of 05/16/2018.     Allergies: Patient has no known allergies.  Body mass index is 22.53 kg/m.  Blood pressure (!) 158/63, pulse 80, height 5\' 2"  (1.575 m), weight 123 lb 3.2 oz (55.9 kg), SpO2 98 %.  Review of Systems  Constitutional: Negative for activity change, appetite change, chills, diaphoresis, fatigue, fever and unexpected weight change.  Eyes: Negative for visual disturbance.  Respiratory: Negative for cough, chest tightness, shortness of breath, wheezing and stridor.   Cardiovascular: Negative for chest pain, palpitations and leg swelling.  Gastrointestinal: Negative for abdominal distention, abdominal pain, blood in stool, constipation, diarrhea, nausea and vomiting.  Endocrine: Negative for cold intolerance, heat intolerance, polydipsia, polyphagia and polyuria.  Genitourinary: Negative for difficulty urinating and flank pain.  Musculoskeletal: Negative for arthralgias, back pain, gait problem, joint swelling, myalgias, neck pain and neck stiffness.  Skin: Negative for color change, pallor, rash and wound.  Neurological: Negative for dizziness and headaches.  Hematological: Does not bruise/bleed easily.  Psychiatric/Behavioral: Negative for  confusion, dysphoric mood, hallucinations, self-injury, sleep disturbance and suicidal ideas. The patient is not nervous/anxious and is not hyperactive.        Objective:   Physical Exam  Constitutional: She is oriented to person, place, and time. She appears well-developed and well-nourished. No distress.  HENT:  Head: Normocephalic and atraumatic.  Right Ear: External ear normal. Decreased hearing is  noted.  Left Ear: External ear normal. Decreased hearing is noted.  Nose: Nose normal.  Mouth/Throat: Oropharynx is clear and moist. Abnormal dentition.  Eyes: Pupils are equal, round, and reactive to light. Conjunctivae and EOM are normal.  Cardiovascular: Normal rate, regular rhythm, normal heart sounds and intact distal pulses.  No murmur heard. Pulmonary/Chest: Effort normal and breath sounds normal. No stridor. No respiratory distress. She has no wheezes. She has no rales. She exhibits no tenderness.  Neurological: She is alert and oriented to person, place, and time.  Skin: Skin is warm and dry. Capillary refill takes less than 2 seconds. No rash noted. She is not diaphoretic. No erythema. No pallor.  Psychiatric: She has a normal mood and affect. Her behavior is normal. Judgment and thought content normal.  Nursing note and vitals reviewed.     Assessment & Plan:   1. Healthcare maintenance   2. Essential hypertension   3. Allergic rhinitis, unspecified seasonality, unspecified trigger   4. Thyroid disease     Hypertension Both BP readings above goal CMP drawn today Started on Lisinopril 5mg  QD F/u 4 weeks  Healthcare maintenance Continue to drink plenty of water and follow a Mediterranean diet. Continue your excellent walking regime- it is GREAT! Stop Cetirizine (Zyrtec) 10mg  and start Montelukast (Singulair) 10mg  once daily. Since your blood pressure readings were both above goal, please start once daily Lisinopril 5mg . We will call you when lab results are available. Follow-up in 4 weeks.  Allergic rhinitis She reports sedation with Cetirizine 10mg  Stop Cetirizine (Zyrtec) 10mg  and start Montelukast (Singulair) 10mg  once daily.  Thyroid disease Currently on Levothyroxine QD    FOLLOW-UP:  Return in about 1 month (around 06/13/2018) for Regular Follow Up, HTN, Evaluate Medication Effectiveness.

## 2018-05-16 NOTE — Assessment & Plan Note (Signed)
Continue to drink plenty of water and follow a Mediterranean diet. Continue your excellent walking regime- it is GREAT! Stop Cetirizine (Zyrtec) 10mg  and start Montelukast (Singulair) 10mg  once daily. Since your blood pressure readings were both above goal, please start once daily Lisinopril 5mg . We will call you when lab results are available. Follow-up in 4 weeks.

## 2018-05-16 NOTE — Assessment & Plan Note (Signed)
She reports sedation with Cetirizine 10mg  Stop Cetirizine (Zyrtec) 10mg  and start Montelukast (Singulair) 10mg  once daily.

## 2018-05-16 NOTE — Assessment & Plan Note (Signed)
Both BP readings above goal CMP drawn today Started on Lisinopril 5mg  QD F/u 4 weeks

## 2018-05-16 NOTE — Assessment & Plan Note (Signed)
Currently on Levothyroxine QD

## 2018-05-16 NOTE — Patient Instructions (Addendum)
Mediterranean Diet A Mediterranean diet refers to food and lifestyle choices that are based on the traditions of countries located on the [place]. This way of eating has been shown to help prevent certain conditions and improve outcomes for people who have chronic diseases, like kidney disease and heart disease. What are tips for following this plan? Lifestyle  Cook and eat meals together with your family, when possible.  Drink enough fluid to keep your urine clear or pale yellow.  Be physically active every day. This includes: ? Aerobic exercise like running or swimming. ? Leisure activities like gardening, walking, or housework.  Get 7-8 hours of sleep each night.  If recommended by your health care provider, drink red wine in moderation. This means 1 glass a day for nonpregnant women and 2 glasses a day for men. A glass of wine equals 5 oz (150 mL). Reading food labels  Check the serving size of packaged foods. For foods such as rice and pasta, the serving size refers to the amount of cooked product, not dry.  Check the total fat in packaged foods. Avoid foods that have saturated fat or trans fats.  Check the ingredients list for added sugars, such as corn syrup. Shopping  At the grocery store, buy most of your food from the areas near the walls of the store. This includes: ? Fresh fruits and vegetables (produce). ? Grains, beans, nuts, and seeds. Some of these may be available in unpackaged forms or large amounts (in bulk). ? Fresh seafood. ? Poultry and eggs. ? Low-fat dairy products.  Buy whole ingredients instead of prepackaged foods.  Buy fresh fruits and vegetables in-season from local farmers markets.  Buy frozen fruits and vegetables in resealable bags.  If you do not have access to quality fresh seafood, buy precooked frozen shrimp or canned fish, such as tuna, salmon, or sardines.  Buy small amounts of raw or cooked vegetables, salads, or olives from the  deli or salad bar at your store.  Stock your pantry so you always have certain foods on hand, such as olive oil, canned tuna, canned tomatoes, rice, pasta, and beans. Cooking  Cook foods with extra-virgin olive oil instead of using butter or other vegetable oils.  Have meat as a side dish, and have vegetables or grains as your main dish. This means having meat in small portions or adding small amounts of meat to foods like pasta or stew.  Use beans or vegetables instead of meat in common dishes like chili or lasagna.  Experiment with different cooking methods. Try roasting or broiling vegetables instead of steaming or sauteing them.  Add frozen vegetables to soups, stews, pasta, or rice.  Add nuts or seeds for added healthy fat at each meal. You can add these to yogurt, salads, or vegetable dishes.  Marinate fish or vegetables using olive oil, lemon juice, garlic, and fresh herbs. Meal planning  Plan to eat 1 vegetarian meal one day each week. Try to work up to 2 vegetarian meals, if possible.  Eat seafood 2 or more times a week.  Have healthy snacks readily available, such as: ? Vegetable sticks with hummus. ? Greek yogurt. ? Fruit and nut trail mix.  Eat balanced meals throughout the week. This includes: ? Fruit: 2-3 servings a day ? Vegetables: 4-5 servings a day ? Low-fat dairy: 2 servings a day ? Fish, poultry, or lean meat: 1 serving a day ? Beans and legumes: 2 or more servings a week ? Nuts   and seeds: 1-2 servings a day ? Whole grains: 6-8 servings a day ? Extra-virgin olive oil: 3-4 servings a day  Limit red meat and sweets to only a few servings a month What are my food choices?  Mediterranean diet ? Recommended ? Grains: Whole-grain pasta. Brown rice. Bulgar wheat. Polenta. Couscous. Whole-wheat bread. [place]. ? Vegetables: Artichokes. Beets. Broccoli. Cabbage. Carrots. Eggplant. Green beans. Chard. Kale. Spinach. Onions. Leeks. Peas. Squash.  Tomatoes. Peppers. Radishes. ? Fruits: Apples. Apricots. Avocado. Berries. Bananas. Cherries. Dates. Figs. Grapes. Lemons. Melon. Oranges. Peaches. Plums. Pomegranate. ? Meats and other protein foods: Beans. Almonds. Sunflower seeds. Pine nuts. Peanuts. [place]. Salmon. Scallops. Shrimp. [place]. Tilapia. Clams. Oysters. Eggs. ? Dairy: Low-fat milk. Cheese. Greek yogurt. ? Beverages: Water. Red wine. Herbal tea. ? Fats and oils: Extra virgin olive oil. Avocado oil. Grape seed oil. ? Sweets and desserts: [place] yogurt with honey. Baked apples. Poached pears. Trail mix. ? Seasoning and other foods: Basil. Cilantro. Coriander. Cumin. Mint. Parsley. Sage. Rosemary. Tarragon. Garlic. Oregano. Thyme. Pepper. Balsalmic vinegar. Tahini. Hummus. Tomato sauce. Olives. Mushrooms. ? Limit these ? Grains: Prepackaged pasta or rice dishes. Prepackaged cereal with added sugar. ? Vegetables: Deep fried potatoes (french fries). ? Fruits: Fruit canned in syrup. ? Meats and other protein foods: Beef. Pork. Lamb. Poultry with skin. Hot dogs. [place] Salines. ? Dairy: Ice cream. Sour cream. Whole milk. ? Beverages: Juice. Sugar-sweetened soft drinks. Beer. Liquor and spirits. ? Fats and oils: Butter. Canola oil. Vegetable oil. Beef fat (tallow). Lard. ? Sweets and desserts: Cookies. Cakes. Pies. Candy. ? Seasoning and other foods: Mayonnaise. Premade sauces and marinades. ? The items listed may not be a complete list. Talk with your dietitian about what dietary choices are right for you. Summary  The Mediterranean diet includes both food and lifestyle choices.  Eat a variety of fresh fruits and vegetables, beans, nuts, seeds, and whole grains.  Limit the amount of red meat and sweets that you eat.  Talk with your health care provider about whether it is safe for you to drink red wine in moderation. This means 1 glass a day for nonpregnant women and 2 glasses a day for men. A glass of wine equals 5 oz (150 mL). This information  is not intended to replace advice given to you by your health care provider. Make sure you discuss any questions you have with your health care provider. Document Released: 06/15/2016 Document Revised: 07/18/2016 Document Reviewed: 06/15/2016 Elsevier Interactive Patient Education  2018 [employer].   Hypertension Hypertension, commonly called high blood pressure, is when the force of blood pumping through the arteries is too strong. The arteries are the blood vessels that carry blood from the heart throughout the body. Hypertension forces the heart to work harder to pump blood and may cause arteries to become narrow or stiff. Having untreated or uncontrolled hypertension can cause heart attacks, strokes, kidney disease, and other problems. A blood pressure reading consists of a higher number over a lower number. Ideally, your blood pressure should be below 120/80. The first ("top") number is called the systolic pressure. It is a measure of the pressure in your arteries as your heart beats. The second ("bottom") number is called the diastolic pressure. It is a measure of the pressure in your arteries as the heart relaxes. What are the causes? The cause of this condition is not known. What increases the risk? Some risk factors for high blood pressure are under your control. Others are not. Factors you can  change  Smoking.  Having type 2 diabetes mellitus, high cholesterol, or both.  Not getting enough exercise or physical activity.  Being overweight.  Having too much fat, sugar, calories, or salt (sodium) in your diet.  Drinking too much alcohol. Factors that are difficult or impossible to change  Having chronic kidney disease.  Having a family history of high blood pressure.  Age. Risk increases with age.  Race. You may be at higher risk if you are African-American.  Gender. Men are at higher risk than women before age 70. After age 39, women are at higher risk than  men.  Having obstructive sleep apnea.  Stress. What are the signs or symptoms? Extremely high blood pressure (hypertensive crisis) may cause:  Headache.  Anxiety.  Shortness of breath.  Nosebleed.  Nausea and vomiting.  Severe chest pain.  Jerky movements you cannot control (seizures).  How is this diagnosed? This condition is diagnosed by measuring your blood pressure while you are seated, with your arm resting on a surface. The cuff of the blood pressure monitor will be placed directly against the skin of your upper arm at the level of your heart. It should be measured at least twice using the same arm. Certain conditions can cause a difference in blood pressure between your right and left arms. Certain factors can cause blood pressure readings to be lower or higher than normal (elevated) for a short period of time:  When your blood pressure is higher when you are in a health care provider's office than when you are at home, this is called white coat hypertension. Most people with this condition do not need medicines.  When your blood pressure is higher at home than when you are in a health care provider's office, this is called masked hypertension. Most people with this condition may need medicines to control blood pressure.  If you have a high blood pressure reading during one visit or you have normal blood pressure with other risk factors:  You may be asked to return on a different day to have your blood pressure checked again.  You may be asked to monitor your blood pressure at home for 1 week or longer.  If you are diagnosed with hypertension, you may have other blood or imaging tests to help your health care provider understand your overall risk for other conditions. How is this treated? This condition is treated by making healthy lifestyle changes, such as eating healthy foods, exercising more, and reducing your alcohol intake. Your health care provider may prescribe  medicine if lifestyle changes are not enough to get your blood pressure under control, and if:  Your systolic blood pressure is above 130.  Your diastolic blood pressure is above 80.  Your personal target blood pressure may vary depending on your medical conditions, your age, and other factors. Follow these instructions at home: Eating and drinking  Eat a diet that is high in fiber and potassium, and low in sodium, added sugar, and fat. An example eating plan is called the DASH (Dietary Approaches to Stop Hypertension) diet. To eat this way: ? Eat plenty of fresh fruits and vegetables. Try to fill half of your plate at each meal with fruits and vegetables. ? Eat whole grains, such as whole wheat pasta, brown rice, or whole grain bread. Fill about one quarter of your plate with whole grains. ? Eat or drink low-fat dairy products, such as skim milk or low-fat yogurt. ? Avoid fatty cuts of meat, processed  or cured meats, and poultry with skin. Fill about one quarter of your plate with lean proteins, such as fish, chicken without skin, beans, eggs, and tofu. ? Avoid premade and processed foods. These tend to be higher in sodium, added sugar, and fat.  Reduce your daily sodium intake. Most people with hypertension should eat less than 1,500 mg of sodium a day.  Limit alcohol intake to no more than 1 drink a day for nonpregnant women and 2 drinks a day for men. One drink equals 12 oz of beer, 5 oz of wine, or 1 oz of hard liquor. Lifestyle  Work with your health care provider to maintain a healthy body weight or to lose weight. Ask what an ideal weight is for you.  Get at least 30 minutes of exercise that causes your heart to beat faster (aerobic exercise) most days of the week. Activities may include walking, swimming, or biking.  Include exercise to strengthen your muscles (resistance exercise), such as pilates or lifting weights, as part of your weekly exercise routine. Try to do these types  of exercises for 30 minutes at least 3 days a week.  Do not use any products that contain nicotine or tobacco, such as cigarettes and e-cigarettes. If you need help quitting, ask your health care provider.  Monitor your blood pressure at home as told by your health care provider.  Keep all follow-up visits as told by your health care provider. This is important. Medicines  Take over-the-counter and prescription medicines only as told by your health care provider. Follow directions carefully. Blood pressure medicines must be taken as prescribed.  Do not skip doses of blood pressure medicine. Doing this puts you at risk for problems and can make the medicine less effective.  Ask your health care provider about side effects or reactions to medicines that you should watch for. Contact a health care provider if:  You think you are having a reaction to a medicine you are taking.  You have headaches that keep coming back (recurring).  You feel dizzy.  You have swelling in your ankles.  You have trouble with your vision. Get help right away if:  You develop a severe headache or confusion.  You have unusual weakness or numbness.  You feel faint.  You have severe pain in your chest or abdomen.  You vomit repeatedly.  You have trouble breathing. Summary  Hypertension is when the force of blood pumping through your arteries is too strong. If this condition is not controlled, it may put you at risk for serious complications.  Your personal target blood pressure may vary depending on your medical conditions, your age, and other factors. For most people, a normal blood pressure is less than 120/80.  Hypertension is treated with lifestyle changes, medicines, or a combination of both. Lifestyle changes include weight loss, eating a healthy, low-sodium diet, exercising more, and limiting alcohol. This information is not intended to replace advice given to you by your health care provider.  Make sure you discuss any questions you have with your health care provider. Document Released: 10/23/2005 Document Revised: 09/20/2016 Document Reviewed: 09/20/2016 Elsevier Interactive Patient Education  2018 [employer].   Continue to drink plenty of water and follow a Mediterranean diet. Continue your excellent walking regime- it is GREAT! Stop Cetirizine (Zyrtec) 10mg  and start Montelukast (Singulair) 10mg  once daily. Since your blood pressure readings were both above goal, please start once daily Lisinopril 5mg . We will call you when lab results are  available. Follow-up in 4 weeks. WELCOME TO THE PRACTICE!

## 2018-05-17 LAB — COMPREHENSIVE METABOLIC PANEL
A/G RATIO: 2 (ref 1.2–2.2)
ALT: 14 IU/L (ref 0–32)
AST: 20 IU/L (ref 0–40)
Albumin: 4.5 g/dL (ref 3.5–4.8)
Alkaline Phosphatase: 85 IU/L (ref 39–117)
BUN / CREAT RATIO: 26 (ref 12–28)
BUN: 21 mg/dL (ref 8–27)
Bilirubin Total: 0.5 mg/dL (ref 0.0–1.2)
CALCIUM: 9.7 mg/dL (ref 8.7–10.3)
CO2: 23 mmol/L (ref 20–29)
Chloride: 106 mmol/L (ref 96–106)
Creatinine, Ser: 0.81 mg/dL (ref 0.57–1.00)
GFR calc Af Amer: 81 mL/min/{1.73_m2} (ref >59)
GFR, EST NON AFRICAN AMERICAN: 70 mL/min/{1.73_m2} (ref >59)
GLOBULIN, TOTAL: 2.2 g/dL (ref 1.5–4.5)
Glucose: 90 mg/dL (ref 65–99)
POTASSIUM: 4.1 mmol/L (ref 3.5–5.2)
SODIUM: 145 mmol/L — AB (ref 134–144)
Total Protein: 6.7 g/dL (ref 6.0–8.5)

## 2018-06-13 ENCOUNTER — Ambulatory Visit (INDEPENDENT_AMBULATORY_CARE_PROVIDER_SITE_OTHER): Payer: Medicare Other | Admitting: Adult Health

## 2018-06-13 ENCOUNTER — Encounter: Payer: Self-pay | Admitting: Adult Health

## 2018-06-13 VITALS — BP 137/58 | HR 79 | Ht 62.0 in | Wt 123.9 lb

## 2018-06-13 DIAGNOSIS — I10 Essential (primary) hypertension: Secondary | ICD-10-CM | POA: Diagnosis not present

## 2018-06-13 MED ORDER — AMLODIPINE BESYLATE 2.5 MG PO TABS: 2.5000 mg | ORAL_TABLET | Freq: Every day | ORAL | 0 refills | Status: DC

## 2018-06-13 NOTE — Progress Notes (Signed)
Subjective:    Patient ID: Alejandra Alexander, female    DOB: [birth year], 77 y.o.   MRN: [id number]  HPI : 05/16/18 OV:  Alejandra Alexander is here to establish as a new pt.  She is a pleasant 77 year old female. PMH:  HTN, Hypothyroidism, Seasonal Allergies, and elevated blood pressure. She reports walking > 4 miles/daily and follows a heart healthy diet. She drinks a combination of water and green tea. She denies tobacco/ETOH use She is widowed and has a large local support system of friends and an adult daughter in [place], [place]  06/13/18 OV: Alejandra Alexander is here for f/u: HTN She was started on Lisinopril 10mg  QD  She stopped Rx after three days due to sig weakness and blurred vision. She was not checking BP/HR during this time She has since increased daily walking, sig reduced Na++ intake and increased garlic in diet She denies CP/dyspnea/dizziness/palpitations  Patient Care Team    Relationship Specialty Notifications Start End  Julaine Fusi, NP PCP - General Family Medicine  05/16/18     Patient Active Problem List   Diagnosis Date Noted  . Hypertension 05/16/2018  . Healthcare maintenance 05/16/2018  . Thyroid disease 10/02/2013  . Allergic rhinitis 10/02/2013  . Left thyroid nodule 09/30/2013     Past Medical History:  Diagnosis Date  . Allergy    seasonal  . Anemia   . Chickenpox   . Hypertension   . Thyroid disease      History reviewed. No pertinent surgical history.   Family History  Problem Relation Age of Onset  . Heart disease Father   . Stroke Father      Social History   Substance and Sexual Activity  Drug Use No     Social History   Substance and Sexual Activity  Alcohol Use No     Social History   Tobacco Use  Smoking Status Never Smoker  Smokeless Tobacco Never Used     Outpatient Encounter Medications as of 06/13/2018  Medication Sig  . levothyroxine (SYNTHROID, LEVOTHROID) 25 MCG tablet Take 1 tablet (25 mcg total) by mouth daily  before breakfast.  . montelukast (SINGULAIR) 10 MG tablet Take 1 tablet (10 mg total) by mouth at bedtime.  Marland Kitchen amLODipine (NORVASC) 2.5 MG tablet Take 1 tablet (2.5 mg total) by mouth daily.  . [DISCONTINUED] lisinopril (PRINIVIL,ZESTRIL) 5 MG tablet Take 1 tablet (5 mg total) by mouth daily.   No facility-administered encounter medications on file as of 06/13/2018.     Allergies: Lisinopril  Body mass index is 22.66 kg/m.  Blood pressure (!) 137/58, pulse 79, height 5\' 2"  (1.575 m), weight 123 lb 14.4 oz (56.2 kg), SpO2 96 %.  Review of Systems  Constitutional: Negative for activity change, appetite change, chills, diaphoresis, fatigue, fever and unexpected weight change.  Eyes: Negative for visual disturbance.  Respiratory: Negative for cough, chest tightness, shortness of breath, wheezing and stridor.   Cardiovascular: Negative for chest pain, palpitations and leg swelling.  Gastrointestinal: Negative for abdominal distention, abdominal pain, blood in stool, constipation, diarrhea, nausea and vomiting.  Endocrine: Negative for cold intolerance, heat intolerance, polydipsia, polyphagia and polyuria.  Genitourinary: Negative for difficulty urinating and flank pain.  Musculoskeletal: Negative for arthralgias, back pain, gait problem, joint swelling, myalgias, neck pain and neck stiffness.  Skin: Negative for color change, pallor, rash and wound.  Neurological: Negative for dizziness and headaches.  Hematological: Does not bruise/bleed easily.  Psychiatric/Behavioral: Negative for confusion, dysphoric mood,  hallucinations, self-injury, sleep disturbance and suicidal ideas. The patient is not nervous/anxious and is not hyperactive.       Objective:   Physical Exam  Constitutional: She is oriented to person, place, and time. She appears well-developed and well-nourished. No distress.  HENT:  Head: Normocephalic and atraumatic.  Right Ear: External ear normal. Decreased hearing is noted.   Left Ear: External ear normal. Decreased hearing is noted.  Nose: Nose normal.  Mouth/Throat: Oropharynx is clear and moist. Abnormal dentition.  Eyes: Pupils are equal, round, and reactive to light. Conjunctivae and EOM are normal.  Cardiovascular: Normal rate, regular rhythm, normal heart sounds and intact distal pulses.  No murmur heard. Pulmonary/Chest: Effort normal and breath sounds normal. No stridor. No respiratory distress. She has no wheezes. She has no rales. She exhibits no tenderness.  Neurological: She is alert and oriented to person, place, and time.  Skin: Skin is warm and dry. Capillary refill takes less than 2 seconds. No rash noted. She is not diaphoretic. No erythema. No pallor.  Psychiatric: She has a normal mood and affect. Her behavior is normal. Judgment and thought content normal.  Nursing note and vitals reviewed.     Assessment & Plan:   1. Essential hypertension     Hypertension BP 170/63, HR 79 Recheck BP 137/58 Stop Lisinopril- now listed as drug allergy Start Amlodipine 2.5mg  once daily Check blood pressure and heart rate daily Call clinic after four days on new medicaiton Follow-up in 2 weeks in office.    FOLLOW-UP:  Return in about 2 weeks (around 06/27/2018) for Regular Follow Up, HTN.

## 2018-06-13 NOTE — Patient Instructions (Signed)

## 2018-06-13 NOTE — Assessment & Plan Note (Addendum)
BP 170/63, HR 79 Recheck BP 137/58 Stop Lisinopril- now listed as drug allergy Start Amlodipine 2.5mg  once daily Check blood pressure and heart rate daily Call clinic after four days on new medicaiton Follow-up in 2 weeks in office.

## 2018-06-19 ENCOUNTER — Telehealth: Payer: Self-pay

## 2018-06-19 NOTE — Telephone Encounter (Signed)
Pt presented to office with BP/HR readings.    06/14/18   179/73    92 06/15/18   151/68  80 06/17/18 146/75  99 06/18/18 139/60  83  Log placed in Katy's inbox. Please advise.  Tiajuana Amass. Maximilliano Kersh, CMA

## 2018-06-23 NOTE — Telephone Encounter (Signed)
Numbers improving Will discuss at her OV this wee Thanks! Orpha BurKaty

## 2018-06-26 NOTE — Progress Notes (Signed)
Subjective:    Patient ID: Alejandra Alexander, female    DOB: October 12, 1941, 77 y.o.   MRN: [id number]  HPI : 05/16/18 OV:  [name] is here to establish as a new pt.  She is a pleasant 77 year old female. PMH:  HTN, Hypothyroidism, Seasonal Allergies, and elevated blood pressure. She reports walking > 4 miles/daily and follows a heart healthy diet. She drinks a combination of water and green tea. She denies tobacco/ETOH use She is widowed and has a large local support system of friends and an adult daughter in [place], [place]  06/13/18 OV: [name] is here for f/u: HTN She was started on Lisinopril 10mg  QD  She stopped Rx after three days due to sig weakness and blurred vision. She was not checking BP/HR during this time She has since increased daily walking, sig reduced Na++ intake and increased garlic in diet She denies CP/dyspnea/dizziness/palpitations  06/26/18 OV: [name] is here for f/u: HTN  She was initially started on Lisinopril 10mg  and experienced weakness/blurred vision. ACE d/c'd, then started on Amlodipine 2.5mg  QD She reports medication compliance, denies SE She denies CP/dyspnea/dizziness/HA She reports home readings- SBPs 130-140 DBPs 70-80 HR 70-80 She continues to walk 4-5 miles/day and follow heart healthy diet  Patient Care Team    Relationship Specialty Notifications Start End  Julaine Fusianford, Nguyen Butler D, NP PCP - General Family Medicine  05/16/18     Patient Active Problem List   Diagnosis Date Noted  . Hypertension 05/16/2018  . Healthcare maintenance 05/16/2018  . Thyroid disease 10/02/2013  . Allergic rhinitis 10/02/2013  . Left thyroid nodule 09/30/2013     Past Medical History:  Diagnosis Date  . Allergy    seasonal  . Anemia   . Chickenpox   . Hypertension   . Thyroid disease      History reviewed. No pertinent surgical history.   Family History  Problem Relation Age of Onset  . Heart disease Father   . Stroke Father      Social History    Substance and Sexual Activity  Drug Use No     Social History   Substance and Sexual Activity  Alcohol Use No     Social History   Tobacco Use  Smoking Status Never Smoker  Smokeless Tobacco Never Used     Outpatient Encounter Medications as of 06/27/2018  Medication Sig  . amLODipine (NORVASC) 2.5 MG tablet Take 1 tablet (2.5 mg total) by mouth daily.  Marland Kitchen. levothyroxine (SYNTHROID, LEVOTHROID) 25 MCG tablet Take 1 tablet (25 mcg total) by mouth daily before breakfast.  . montelukast (SINGULAIR) 10 MG tablet Take 1 tablet (10 mg total) by mouth at bedtime.   No facility-administered encounter medications on file as of 06/27/2018.     Allergies: Lisinopril  Body mass index is 22.64 kg/m.  Blood pressure 134/73, pulse 86, height 5\' 2"  (1.575 m), weight 123 lb 12.8 oz (56.2 kg), SpO2 97 %.  Review of Systems  Constitutional: Negative for activity change, appetite change, chills, diaphoresis, fatigue, fever and unexpected weight change.  Eyes: Negative for visual disturbance.  Respiratory: Negative for cough, chest tightness, shortness of breath, wheezing and stridor.   Cardiovascular: Negative for chest pain, palpitations and leg swelling.  Gastrointestinal: Negative for abdominal distention, abdominal pain, blood in stool, constipation, diarrhea, nausea and vomiting.  Endocrine: Negative for cold intolerance, heat intolerance, polydipsia, polyphagia and polyuria.  Genitourinary: Negative for difficulty urinating and flank pain.  Musculoskeletal: Negative for arthralgias,  back pain, gait problem, joint swelling, myalgias, neck pain and neck stiffness.  Skin: Negative for color change, pallor, rash and wound.  Neurological: Negative for dizziness and headaches.  Hematological: Does not bruise/bleed easily.  Psychiatric/Behavioral: Negative for confusion, dysphoric mood, hallucinations, self-injury, sleep disturbance and suicidal ideas. The patient is not nervous/anxious  and is not hyperactive.       Objective:   Physical Exam  Constitutional: She is oriented to person, place, and time. She appears well-developed and well-nourished. No distress.  HENT:  Head: Normocephalic and atraumatic.  Right Ear: External ear normal. Decreased hearing is noted.  Left Ear: External ear normal. Decreased hearing is noted.  Nose: Nose normal.  Mouth/Throat: Oropharynx is clear and moist. Abnormal dentition.  Eyes: Pupils are equal, round, and reactive to light. Conjunctivae and EOM are normal.  Cardiovascular: Normal rate, regular rhythm, normal heart sounds and intact distal pulses.  No murmur heard. Pulmonary/Chest: Effort normal and breath sounds normal. No stridor. No respiratory distress. She has no wheezes. She has no rales. She exhibits no tenderness.  Neurological: She is alert and oriented to person, place, and time.  Skin: Skin is warm and dry. Capillary refill takes less than 2 seconds. No rash noted. She is not diaphoretic. No erythema. No pallor.  Psychiatric: She has a normal mood and affect. Her behavior is normal. Judgment and thought content normal.  Nursing note and vitals reviewed.     Assessment & Plan:   1. Essential hypertension   2. Healthcare maintenance     Hypertension BP at goal 134/73 Continue Amlodipine 2.5mg  QD Continue to check BP/HR at home Continue excellent walking program and following a heart healthy diet   Healthcare maintenance Your blood pressure is at goal and your are tolerating Amlodipine well- continue on current dosage. Continue to walk daily, follow heart healthy diet, and drink plenty of water. Recommend Medicare Wellness Visit in 6 months.    FOLLOW-UP:  Return in about 6 months (around 12/28/2018).

## 2018-06-27 ENCOUNTER — Ambulatory Visit (INDEPENDENT_AMBULATORY_CARE_PROVIDER_SITE_OTHER): Payer: Medicare Other | Admitting: Adult Health

## 2018-06-27 ENCOUNTER — Encounter: Payer: Self-pay | Admitting: Adult Health

## 2018-06-27 VITALS — BP 134/73 | HR 86 | Ht 62.0 in | Wt 123.8 lb

## 2018-06-27 DIAGNOSIS — I10 Essential (primary) hypertension: Secondary | ICD-10-CM

## 2018-06-27 DIAGNOSIS — Z0000 Encounter for general adult medical examination without abnormal findings: Secondary | ICD-10-CM | POA: Diagnosis not present

## 2018-06-27 NOTE — Patient Instructions (Signed)

## 2018-06-27 NOTE — Assessment & Plan Note (Signed)
Your blood pressure is at goal and your are tolerating Amlodipine well- continue on current dosage. Continue to walk daily, follow heart healthy diet, and drink plenty of water. Recommend Medicare Wellness Visit in 6 months.

## 2018-06-27 NOTE — Assessment & Plan Note (Signed)
BP at goal 134/73 Continue Amlodipine 2.5mg  QD Continue to check BP/HR at home Continue excellent walking program and following a heart healthy diet

## 2018-07-04 ENCOUNTER — Telehealth: Payer: Self-pay

## 2018-07-04 ENCOUNTER — Other Ambulatory Visit: Payer: Self-pay | Admitting: Adult Health

## 2018-07-04 NOTE — Telephone Encounter (Signed)
Good Evening Tonya, Can you please tell [name] to make OV to dicuss. Thanks! Orpha BurKaty

## 2018-07-04 NOTE — Telephone Encounter (Signed)
Pt states that she believes amlodipine is causing her right 2nd and 3rd toe to hurt and swell.  Pt denies any injury, but states that onset of pain was at 2am today.  Please advise.  Tiajuana Amass. Nelson, CMA

## 2018-07-04 NOTE — Telephone Encounter (Signed)
Pt informed.  Pt transferred to front desk to schedule OV.  Tiajuana Amass. Aerilynn Goin, CMA

## 2018-07-09 NOTE — Progress Notes (Signed)
Subjective:    Patient ID: Alejandra Alexander, female    DOB: April 21, 1941, 77 y.o.   MRN: [id number]  HPI : 05/16/18 OV:  Alejandra Alexander is here to establish as a new pt.  She is a pleasant 77 year old female. PMH:  HTN, Hypothyroidism, Seasonal Allergies, and elevated blood pressure. She reports walking > 4 miles/daily and follows a heart healthy diet. She drinks a combination of water and green tea. She denies tobacco/ETOH use She is widowed and has a large local support system of friends and an adult daughter in [place], [place]  06/13/18 OV: Alejandra Alexander is here for f/u: HTN She was started on Lisinopril 10mg  QD  She stopped Rx after three days due to sig weakness and blurred vision. She was not checking BP/HR during this time She has since increased daily walking, sig reduced Na++ intake and increased garlic in diet She denies CP/dyspnea/dizziness/palpitations  06/26/18 OV: Alejandra Alexander is here for f/u: HTN  She was initially started on Lisinopril 10mg  and experienced weakness/blurred vision. ACE d/c'd, then started on Amlodipine 2.5mg  QD She reports medication compliance, denies SE She denies CP/dyspnea/dizziness/HA She reports home readings- SBPs 130-140 DBPs 70-80 HR 70-80 She continues to walk 4-5 miles/day and follow heart healthy diet  07/10/18 OV: Alejandra Alexander presents with R toe pain and edema that started 06/30/18, she has been on amlodipine 2.5mg  QD since 06/26/18 She reports pain is intermittent, described as "plainly hurting", rated 3/10 and will increase with standing/walking.  She has been unable to walk due to pain. She continues to eat a heart healthy diet and denies tobacco/ETOH use  She denies CP/dyspnea/dizziness/HA/palpitations. She reports home readings: SBP 150-160s, DBP 80s, HR 60-70s  Patient Care Team    Relationship Specialty Notifications Start End  Danford, Jinny Blossom, NP PCP - General Family Medicine  05/16/18     Patient Active Problem List   Diagnosis Date Noted  .  Hypertension 05/16/2018  . Healthcare maintenance 05/16/2018  . Thyroid disease 10/02/2013  . Allergic rhinitis 10/02/2013  . Left thyroid nodule 09/30/2013     Past Medical History:  Diagnosis Date  . Allergy    seasonal  . Anemia   . Chickenpox   . Hypertension   . Thyroid disease      History reviewed. No pertinent surgical history.   Family History  Problem Relation Age of Onset  . Heart disease Father   . Stroke Father      Social History   Substance and Sexual Activity  Drug Use No     Social History   Substance and Sexual Activity  Alcohol Use No     Social History   Tobacco Use  Smoking Status Never Smoker  Smokeless Tobacco Never Used     Outpatient Encounter Medications as of 07/10/2018  Medication Sig  . levothyroxine (SYNTHROID, LEVOTHROID) 25 MCG tablet Take 1 tablet (25 mcg total) by mouth daily before breakfast.  . losartan (COZAAR) 25 MG tablet Take 1 tablet (25 mg total) by mouth daily.  . [DISCONTINUED] montelukast (SINGULAIR) 10 MG tablet Take 1 tablet (10 mg total) by mouth at bedtime.   No facility-administered encounter medications on file as of 07/10/2018.     Allergies: Lisinopril  Body mass index is 22.17 kg/m.  Blood pressure (!) 172/65, pulse 70, height 5\' 2"  (1.575 m), weight 121 lb 3.2 oz (55 kg), SpO2 98 %.  Review of Systems  Constitutional: Negative for activity change, appetite change, chills, diaphoresis,  fatigue, fever and unexpected weight change.  Eyes: Negative for visual disturbance.  Respiratory: Negative for cough, chest tightness, shortness of breath, wheezing and stridor.   Cardiovascular: Negative for chest pain, palpitations and leg swelling.  Gastrointestinal: Negative for abdominal distention, abdominal pain, blood in stool, constipation, diarrhea, nausea and vomiting.  Endocrine: Negative for cold intolerance, heat intolerance, polydipsia, polyphagia and polyuria.  Genitourinary: Negative for  difficulty urinating and flank pain.  Musculoskeletal: Negative for arthralgias, back pain, gait problem, joint swelling, myalgias, neck pain and neck stiffness.  Skin: Negative for color change, pallor, rash and wound.  Neurological: Negative for dizziness and headaches.  Hematological: Does not bruise/bleed easily.  Psychiatric/Behavioral: Negative for confusion, dysphoric mood, hallucinations, self-injury, sleep disturbance and suicidal ideas. The patient is not nervous/anxious and is not hyperactive.        Objective:   Physical Exam  Constitutional: She is oriented to person, place, and time. She appears well-developed and well-nourished. No distress.  HENT:  Head: Normocephalic and atraumatic.  Right Ear: External ear normal. Decreased hearing is noted.  Left Ear: External ear normal. Decreased hearing is noted.  Nose: Nose normal.  Mouth/Throat: Oropharynx is clear and moist. Abnormal dentition.  Eyes: Pupils are equal, round, and reactive to light. Conjunctivae and EOM are normal.  Cardiovascular: Normal rate, regular rhythm, normal heart sounds and intact distal pulses.  No murmur heard. Pulmonary/Chest: Effort normal and breath sounds normal. No stridor. No respiratory distress. She has no wheezes. She has no rales. She exhibits no tenderness.  Neurological: She is alert and oriented to person, place, and time.  Skin: Skin is warm and dry. Capillary refill takes less than 2 seconds. No rash noted. She is not diaphoretic. No erythema. No pallor.  Psychiatric: She has a normal mood and affect. Her behavior is normal. Judgment and thought content normal.  Nursing note and vitals reviewed.     Assessment & Plan:   1. Essential hypertension     Hypertension BPs well above goal 182/68, 172/65 HR 70 Home readings- SBP 150-160s, DBP 80s HR 60-70s Lisinopril 10mg  QD caused weakness/blurred vision, therefore d/c'd Amlodipine 2.5mg  QD caused R foot edema/pain, therefore  d/c'd Start Losartan 25mg  once daily. Continue healthy eating and resume regular walking as tolerated. Please check your blood pressure and heart rate daily and bring log to follow-up in 2 weeks (log provided). Discussed at length the uncontrolled HTN increases risk of MI, CVA, and renal damage If are unable to tolerate Losartan 25mg  once daily, we will refer you to hypertensive clinic.  Pt was in the office today for 25+ minutes, I spent >75% of time in face to face counseling of patient's various medical conditions and in coordination of care  FOLLOW-UP:  Return in about 2 weeks (around 07/24/2018) for HTN.

## 2018-07-10 ENCOUNTER — Encounter: Payer: Self-pay | Admitting: Adult Health

## 2018-07-10 ENCOUNTER — Ambulatory Visit (INDEPENDENT_AMBULATORY_CARE_PROVIDER_SITE_OTHER): Payer: Medicare Other | Admitting: Adult Health

## 2018-07-10 VITALS — BP 172/65 | HR 70 | Ht 62.0 in | Wt 121.2 lb

## 2018-07-10 DIAGNOSIS — I10 Essential (primary) hypertension: Secondary | ICD-10-CM | POA: Diagnosis not present

## 2018-07-10 MED ORDER — LOSARTAN POTASSIUM 25 MG PO TABS: 25.0000 mg | ORAL_TABLET | Freq: Every day | ORAL | 0 refills | Status: DC

## 2018-07-10 NOTE — Patient Instructions (Signed)

## 2018-07-10 NOTE — Assessment & Plan Note (Addendum)
BPs well above goal 182/68, 172/65 HR 70 Home readings- SBP 150-160s, DBP 80s HR 60-70s Lisinopril 10mg  QD caused weakness/blurred vision, therefore d/c'd Amlodipine 2.5mg  QD caused R foot edema/pain, therefore d/c'd Start Losartan 25mg  once daily. Continue healthy eating and resume regular walking as tolerated. Please check your blood pressure and heart rate daily and bring log to follow-up in 2 weeks (log provided). Discussed at length the uncontrolled HTN increases risk of MI, CVA, and renal damage If are unable to tolerate Losartan 25mg  once daily, we will refer you to hypertensive clinic.

## 2018-07-15 ENCOUNTER — Telehealth: Payer: Self-pay | Admitting: Adult Health

## 2018-07-15 MED ORDER — LOSARTAN POTASSIUM 25 MG PO TABS: 25.0000 mg | ORAL_TABLET | Freq: Every day | ORAL | 0 refills | Status: DC

## 2018-07-15 NOTE — Telephone Encounter (Signed)
Patient states that she left her BP meds while visiting her sister in [place], she is hoping for a new prescription to be sent to [facility]. Please advise

## 2018-07-15 NOTE — Addendum Note (Signed)
Addended by: Stan Head on: 07/15/2018 03:33 PM   Modules accepted: Orders

## 2018-07-24 ENCOUNTER — Encounter: Payer: Self-pay | Admitting: Adult Health

## 2018-07-24 ENCOUNTER — Ambulatory Visit (INDEPENDENT_AMBULATORY_CARE_PROVIDER_SITE_OTHER): Payer: Medicare Other | Admitting: Adult Health

## 2018-07-24 DIAGNOSIS — I10 Essential (primary) hypertension: Secondary | ICD-10-CM

## 2018-07-24 MED ORDER — LOSARTAN POTASSIUM 50 MG PO TABS: 50.0000 mg | ORAL_TABLET | Freq: Every day | ORAL | 1 refills | Status: DC

## 2018-07-24 NOTE — Assessment & Plan Note (Addendum)
BP 166/67 recheck 171/73, final recheck 158/72 HR 70s SBP- 130-180, mean 150 DBP- 70-80s HR 60-70s Losartan increased from 25mg  to 50mg  QD Continue to check BP/HR daily and call clinic with readings.

## 2018-07-24 NOTE — Progress Notes (Signed)
Subjective:    Patient ID: Alejandra Alexander, female    DOB: September 28, 1941, 77 y.o.   MRN: [id number]  HPI : 05/16/18 OV:  [name] is here to establish as a new pt.  She is a pleasant 77 year old female. PMH:  HTN, Hypothyroidism, Seasonal Allergies, and elevated blood pressure. She reports walking > 4 miles/daily and follows a heart healthy diet. She drinks a combination of water and green tea. She denies tobacco/ETOH use She is widowed and has a large local support system of friends and an adult daughter in [place], [place]  06/13/18 OV: [name] is here for f/u: HTN She was started on Lisinopril 10mg  QD  She stopped Rx after three days due to sig weakness and blurred vision. She was not checking BP/HR during this time She has since increased daily walking, sig reduced Na++ intake and increased garlic in diet She denies CP/dyspnea/dizziness/palpitations  06/26/18 OV: [name] is here for f/u: HTN  She was initially started on Lisinopril 10mg  and experienced weakness/blurred vision. ACE d/c'd, then started on Amlodipine 2.5mg  QD She reports medication compliance, denies SE She denies CP/dyspnea/dizziness/HA She reports home readings- SBPs 130-140 DBPs 70-80 HR 70-80 She continues to walk 4-5 miles/day and follow heart healthy diet  07/10/18 OV: [name] presents with R toe pain and edema that started 06/30/18, she has been on amlodipine 2.5mg  QD since 06/26/18 She reports pain is intermittent, described as "plainly hurting", rated 3/10 and will increase with standing/walking.  She has been unable to walk due to pain. She continues to eat a heart healthy diet and denies tobacco/ETOH use  She denies CP/dyspnea/dizziness/HA/palpitations. She reports home readings: SBP 150-160s, DBP 80s, HR 60-70s  07/24/18 OV: [name] presents for f/u: HTN  She was started on Losartan 25mg  QD two weeks ago and denies medication SE. She denies CP/dyspnea/dizziness/HA/palpitations. She reports resolution  of R foot/toe pain and has been able to resume regular walking, up to 1.455miles/day and hopes to be back at her usual 4 miles/day in a few weeks. She continues to remain well hydrated and eat a heart healthy diet. Home Readings: SBP- 130-180, mean 150 DBP- 70-80s HR 60-70s  Patient Care Team    Relationship Specialty Notifications Start End  Julaine Fusianford, Cru Kritikos D, NP PCP - General Family Medicine  05/16/18     Patient Active Problem List   Diagnosis Date Noted  . Hypertension 05/16/2018  . Healthcare maintenance 05/16/2018  . Thyroid disease 10/02/2013  . Allergic rhinitis 10/02/2013  . Left thyroid nodule 09/30/2013     Past Medical History:  Diagnosis Date  . Allergy    seasonal  . Anemia   . Chickenpox   . Hypertension   . Thyroid disease      History reviewed. No pertinent surgical history.   Family History  Problem Relation Age of Onset  . Heart disease Father   . Stroke Father      Social History   Substance and Sexual Activity  Drug Use No     Social History   Substance and Sexual Activity  Alcohol Use No     Social History   Tobacco Use  Smoking Status Never Smoker  Smokeless Tobacco Never Used     Outpatient Encounter Medications as of 07/24/2018  Medication Sig  . levothyroxine (SYNTHROID, LEVOTHROID) 25 MCG tablet Take 1 tablet (25 mcg total) by mouth daily before breakfast.  . losartan (COZAAR) 50 MG tablet Take 1 tablet (50 mg total)  by mouth daily.  . [DISCONTINUED] losartan (COZAAR) 25 MG tablet Take 1 tablet (25 mg total) by mouth daily.   No facility-administered encounter medications on file as of 07/24/2018.     Allergies: Lisinopril  Body mass index is 23.69 kg/m.  Blood pressure (!) 158/72, pulse 77, temperature 98.3 F (36.8 C), resp. rate 16, height 5\' 2"  (1.575 m), weight 129 lb 8 oz (58.7 kg), SpO2 97 %.  Review of Systems  Constitutional: Negative for activity change, appetite change, chills, diaphoresis, fatigue,  fever and unexpected weight change.  Eyes: Negative for visual disturbance.  Respiratory: Negative for cough, chest tightness, shortness of breath, wheezing and stridor.   Cardiovascular: Negative for chest pain, palpitations and leg swelling.  Gastrointestinal: Negative for abdominal distention, abdominal pain, blood in stool, constipation, diarrhea, nausea and vomiting.  Endocrine: Negative for cold intolerance, heat intolerance, polydipsia, polyphagia and polyuria.  Genitourinary: Negative for difficulty urinating and flank pain.  Musculoskeletal: Negative for arthralgias, back pain, gait problem, joint swelling, myalgias, neck pain and neck stiffness.  Skin: Negative for color change, pallor, rash and wound.  Neurological: Negative for dizziness and headaches.  Hematological: Does not bruise/bleed easily.  Psychiatric/Behavioral: Negative for confusion, dysphoric mood, hallucinations, self-injury, sleep disturbance and suicidal ideas. The patient is not nervous/anxious and is not hyperactive.        Objective:   Physical Exam  Constitutional: She is oriented to person, place, and time. She appears well-developed and well-nourished. No distress.  HENT:  Head: Normocephalic and atraumatic.  Right Ear: External ear normal. Decreased hearing is noted.  Left Ear: External ear normal. Decreased hearing is noted.  Nose: Nose normal.  Mouth/Throat: Oropharynx is clear and moist. Abnormal dentition.  Eyes: Pupils are equal, round, and reactive to light. Conjunctivae and EOM are normal.  Cardiovascular: Normal rate, regular rhythm, normal heart sounds and intact distal pulses.  No murmur heard. Pulmonary/Chest: Effort normal and breath sounds normal. No stridor. No respiratory distress. She has no wheezes. She has no rales. She exhibits no tenderness.  Neurological: She is alert and oriented to person, place, and time.  Skin: Skin is warm and dry. Capillary refill takes less than 2 seconds. No  rash noted. She is not diaphoretic. No erythema. No pallor.  Psychiatric: She has a normal mood and affect. Her behavior is normal. Judgment and thought content normal.  Nursing note and vitals reviewed.     Assessment & Plan:   1. Essential hypertension     Hypertension BP 166/67 recheck 171/73, final recheck 158/72 HR 70s SBP- 130-180, mean 150 DBP- 70-80s HR 60-70s Losartan increased from 25mg  to 50mg  QD Continue to check BP/HR daily and call clinic with readings.    FOLLOW-UP:  Return in about 3 months (around 10/23/2018) for Fasting Labs, Medicare Wellness.

## 2018-07-24 NOTE — Patient Instructions (Addendum)

## 2018-07-26 ENCOUNTER — Telehealth: Payer: Self-pay | Admitting: Adult Health

## 2018-07-26 NOTE — Telephone Encounter (Signed)
Patient came by office 9/20 says  Rx: losartan (COZAAR) 50 MG tablet [[id number]][[id number]]   Order Details  Dose: 50 mg Route: Oral Frequency: Daily  Indications of Use: Hypertension  Dispense Quantity: 90 tablet Refills: 1 Fills remaining: --        Sig: Take 1 tablet (50 mg total) by mouth daily.          -- Rx is causing frequent urination & she called pharmacist who advised her to go back to [facility] of Losartin if she had any left over pill.the patient came in w/ bottle of (Cozzar for 25 MG tab) & says she will be take those until OV w/ Katy on Monday, 9/23.  --Forwarding message to provider for review.  --glh

## 2018-07-29 ENCOUNTER — Ambulatory Visit (INDEPENDENT_AMBULATORY_CARE_PROVIDER_SITE_OTHER): Payer: Medicare Other | Admitting: Adult Health

## 2018-07-29 ENCOUNTER — Encounter: Payer: Self-pay | Admitting: Adult Health

## 2018-07-29 VITALS — BP 147/66 | HR 89 | Ht 62.0 in | Wt 121.6 lb

## 2018-07-29 DIAGNOSIS — I10 Essential (primary) hypertension: Secondary | ICD-10-CM

## 2018-07-29 MED ORDER — LOSARTAN POTASSIUM 25 MG PO TABS: ORAL_TABLET | ORAL | 0 refills | Status: DC

## 2018-07-29 NOTE — Progress Notes (Signed)
Subjective:    Patient ID: Alejandra Alexander, female    DOB: 1941/05/18, 77 y.o.   MRN: [id number]  HPI : 05/16/18 OV:  Alejandra Alexander is here to establish as a new pt.  She is a pleasant 77 year old female. PMH:  HTN, Hypothyroidism, Seasonal Allergies, and elevated blood pressure. She reports walking > 4 miles/daily and follows a heart healthy diet. She drinks a combination of water and green tea. She denies tobacco/ETOH use She is widowed and has a large local support system of friends and an adult daughter in [place], [place]  06/13/18 OV: Alejandra Alexander is here for f/u: HTN She was started on Lisinopril 10mg  QD  She stopped Rx after three days due to sig weakness and blurred vision. She was not checking BP/HR during this time She has since increased daily walking, sig reduced Na++ intake and increased garlic in diet She denies CP/dyspnea/dizziness/palpitations  06/26/18 OV: Alejandra Alexander is here for f/u: HTN  She was initially started on Lisinopril 10mg  and experienced weakness/blurred vision. ACE d/c'd, then started on Amlodipine 2.5mg  QD She reports medication compliance, denies SE She denies CP/dyspnea/dizziness/HA She reports home readings- SBPs 130-140 DBPs 70-80 HR 70-80 She continues to walk 4-5 miles/day and follow heart healthy diet  07/10/18 OV: Alejandra Alexander presents with R toe pain and edema that started 06/30/18, she has been on amlodipine 2.5mg  QD since 06/26/18 She reports pain is intermittent, described as "plainly hurting", rated 3/10 and will increase with standing/walking.  She has been unable to walk due to pain. She continues to eat a heart healthy diet and denies tobacco/ETOH use  She denies CP/dyspnea/dizziness/HA/palpitations. She reports home readings: SBP 150-160s, DBP 80s, HR 60-70s  07/24/18 OV: Alejandra Alexander presents for f/u: HTN  She was started on Losartan 25mg  QD two weeks ago and denies medication SE. She denies CP/dyspnea/dizziness/HA/palpitations. She reports resolution  of R foot/toe pain and has been Alexander to resume regular walking, up to 1.67miles/day and hopes to be back at her usual 4 miles/day in a few weeks. She continues to remain well hydrated and eat a heart healthy diet. Home Readings: SBP- 130-180, mean 150 DBP- 70-80s HR 60-70s  07/29/18 OV: Alejandra Alexander presents with frequency urination since starting Losartan 50mg  QD- this tablet has a green dye and she feels that this is causing the urinary sx's She denies CP/dyspnea/dizziness/HA/palpitations. Home BD readings SBP 130-150 DBP 70-80  Patient Care Team    Relationship Specialty Notifications Start End  Julaine Fusi, NP PCP - General Family Medicine  05/16/18     Patient Active Problem List   Diagnosis Date Noted  . Hypertension 05/16/2018  . Healthcare maintenance 05/16/2018  . Thyroid disease 10/02/2013  . Allergic rhinitis 10/02/2013  . Left thyroid nodule 09/30/2013     Past Medical History:  Diagnosis Date  . Allergy    seasonal  . Anemia   . Chickenpox   . Hypertension   . Thyroid disease      History reviewed. No pertinent surgical history.   Family History  Problem Relation Age of Onset  . Heart disease Father   . Stroke Father      Social History   Substance and Sexual Activity  Drug Use No     Social History   Substance and Sexual Activity  Alcohol Use No     Social History   Tobacco Use  Smoking Status Never Smoker  Smokeless Tobacco Never Used     Outpatient  Encounter Medications as of 07/29/2018  Medication Sig  . levothyroxine (SYNTHROID, LEVOTHROID) 25 MCG tablet Take 1 tablet (25 mcg total) by mouth daily before breakfast.  . [DISCONTINUED] losartan (COZAAR) 50 MG tablet Take 1 tablet (50 mg total) by mouth daily.  Marland Kitchen. losartan (COZAAR) 25 MG tablet Take 2 tablets each am to equal 50mg  once daily   No facility-administered encounter medications on file as of 07/29/2018.     Allergies: Lisinopril  Body mass index is 22.24  kg/m.  Blood pressure (!) 147/66, pulse 89, height 5\' 2"  (1.575 m), weight 121 lb 9.6 oz (55.2 kg), SpO2 94 %.  Review of Systems  Constitutional: Negative for activity change, appetite change, chills, diaphoresis, fatigue, fever and unexpected weight change.  Eyes: Negative for visual disturbance.  Respiratory: Negative for cough, chest tightness, shortness of breath, wheezing and stridor.   Cardiovascular: Negative for chest pain, palpitations and leg swelling.  Gastrointestinal: Negative for abdominal distention, abdominal pain, blood in stool, constipation, diarrhea, nausea and vomiting.  Endocrine: Negative for cold intolerance, heat intolerance, polydipsia, polyphagia and polyuria.  Genitourinary: Negative for difficulty urinating and flank pain.  Musculoskeletal: Negative for arthralgias, back pain, gait problem, joint swelling, myalgias, neck pain and neck stiffness.  Skin: Negative for color change, pallor, rash and wound.  Neurological: Negative for dizziness and headaches.  Hematological: Does not bruise/bleed easily.  Psychiatric/Behavioral: Negative for confusion, dysphoric mood, hallucinations, self-injury, sleep disturbance and suicidal ideas. The patient is not nervous/anxious and is not hyperactive.        Objective:   Physical Exam  Constitutional: She is oriented to person, place, and time. She appears well-developed and well-nourished. No distress.  HENT:  Head: Normocephalic and atraumatic.  Right Ear: External ear normal. Decreased hearing is noted.  Left Ear: External ear normal. Decreased hearing is noted.  Nose: Nose normal.  Mouth/Throat: Oropharynx is clear and moist. Abnormal dentition.  Eyes: Pupils are equal, round, and reactive to light. Conjunctivae and EOM are normal.  Cardiovascular: Normal rate, regular rhythm, normal heart sounds and intact distal pulses.  No murmur heard. Pulmonary/Chest: Effort normal and breath sounds normal. No stridor. No  respiratory distress. She has no wheezes. She has no rales. She exhibits no tenderness.  Neurological: She is alert and oriented to person, place, and time.  Skin: Skin is warm and dry. Capillary refill takes less than 2 seconds. No rash noted. She is not diaphoretic. No erythema. No pallor.  Psychiatric: She has a normal mood and affect. Her behavior is normal. Judgment and thought content normal.  Nursing note and vitals reviewed.     Assessment & Plan:   1. Essential hypertension     Hypertension BP stable 147/66, HR 89 Home readings: SBP 130-150 DBP 70-80 She experienced urinary frequency with Losartan 50mg  (gree dye in tablet) Stop Losartan 50mg  once daily (with the green dye in tablet). Re-start Losartan 25mg  (white tablet)- 2 tablets each am. Continue to check blood pressure and heart rate daily. Follow-up as needed.   FOLLOW-UP:  Return if symptoms worsen or fail to improve.

## 2018-07-29 NOTE — Telephone Encounter (Signed)
Will address at OV today.

## 2018-07-29 NOTE — Patient Instructions (Addendum)

## 2018-07-29 NOTE — Assessment & Plan Note (Addendum)
BP stable 147/66, HR 89 Home readings: SBP 130-150 DBP 70-80 She experienced urinary frequency with Losartan 50mg  (gree dye in tablet) Stop Losartan 50mg  once daily (with the green dye in tablet). Re-start Losartan 25mg  (white tablet)- 2 tablets each am. Continue to check blood pressure and heart rate daily. Follow-up as needed.

## 2018-10-23 ENCOUNTER — Encounter: Payer: Self-pay | Admitting: Adult Health

## 2018-10-23 ENCOUNTER — Ambulatory Visit (INDEPENDENT_AMBULATORY_CARE_PROVIDER_SITE_OTHER): Payer: Medicare Other | Admitting: Adult Health

## 2018-10-23 VITALS — BP 167/75 | HR 74 | Temp 97.6°F | Ht 62.0 in | Wt 123.0 lb

## 2018-10-23 DIAGNOSIS — Z23 Encounter for immunization: Secondary | ICD-10-CM | POA: Diagnosis not present

## 2018-10-23 DIAGNOSIS — Z780 Asymptomatic menopausal state: Secondary | ICD-10-CM

## 2018-10-23 DIAGNOSIS — I10 Essential (primary) hypertension: Secondary | ICD-10-CM

## 2018-10-23 DIAGNOSIS — Z0000 Encounter for general adult medical examination without abnormal findings: Secondary | ICD-10-CM | POA: Diagnosis not present

## 2018-10-23 DIAGNOSIS — E079 Disorder of thyroid, unspecified: Secondary | ICD-10-CM

## 2018-10-23 DIAGNOSIS — E2839 Other primary ovarian failure: Secondary | ICD-10-CM

## 2018-10-23 NOTE — Patient Instructions (Addendum)
  [name] , Thank you for taking time to come for your Medicare Wellness Visit. I appreciate your ongoing commitment to your health goals. Please review the following plan we discussed and let me know if I can assist you in the future.   These are the goals we discussed: GOALS: Continue to drink plenty of water and continue to follow Heart Healthy Diet. Continue all current medications as directed. Please follow-up in 1 month (come fasting so we can obtain labs) and if blood pressure is still above goal we will increase Losartan dose. HAPPY HOLIDAYS! NICE TO SEE YOU!  This is a list of the screening recommended for you and due dates:  Health Maintenance  Topic Date Due  . Tetanus Vaccine  08/31/1959  . DEXA scan (bone density measurement)  08/30/2005  . Flu Shot  06/06/2018  . Pneumonia vaccines  Completed

## 2018-10-23 NOTE — Progress Notes (Signed)
Subjective:   Alejandra Alexander is a 77 y.o. female who presents for Medicare Annual (Subsequent) preventive examination.  Review of Systems:  General:   Denies fever, chills, unexplained weight loss.  Optho/Auditory:   Denies visual changes, blurred vision/LOV Respiratory:   Denies SOB, DOE more than baseline levels.  Cardiovascular:   Denies chest pain, palpitations, new onset peripheral edema  Gastrointestinal:   Denies nausea, vomiting, diarrhea.  Genitourinary: Denies dysuria, freq/ urgency, flank pain or discharge from genitals.  Endocrine:     Denies hot or cold intolerance, polyuria, polydipsia. Musculoskeletal:   Denies unexplained myalgias, joint swelling, unexplained arthralgias, gait problems.  Skin:  Denies rash, suspicious lesions Neurological:     Denies dizziness, unexplained weakness, numbness  Psychiatric/Behavioral:   Denies mood changes, suicidal or homicidal ideations, hallucinations       Objective:     Vitals: BP (!) 167/75   Pulse 74   Temp 97.6 F (36.4 C) (Oral)   Ht 5\' 2"  (1.575 m)   Wt 123 lb (55.8 kg)   SpO2 97% Comment: on RA  BMI 22.50 kg/m   Body mass index is 22.5 kg/m.  No flowsheet data found.  Tobacco Social History   Tobacco Use  Smoking Status Never Smoker  Smokeless Tobacco Never Used     Counseling given: Not Answered  Past Medical History:  Diagnosis Date  . Allergy    seasonal  . Anemia   . Chickenpox   . Hypertension   . Thyroid disease    History reviewed. No pertinent surgical history. Family History  Problem Relation Age of Onset  . Heart disease Father   . Stroke Father    Social History   Socioeconomic History  . Marital status: Widowed    Spouse name: Not on file  . Number of children: Not on file  . Years of education: Not on file  . Highest education level: Not on file  Occupational History  . Not on file  Social Needs  . Financial resource strain: Not on file  . Food insecurity:    Worry:  Not on file    Inability: Not on file  . Transportation needs:    Medical: Not on file    Non-medical: Not on file  Tobacco Use  . Smoking status: Never Smoker  . Smokeless tobacco: Never Used  Substance and Sexual Activity  . Alcohol use: No  . Drug use: No  . Sexual activity: Not Currently  Lifestyle  . Physical activity:    Days per week: Not on file    Minutes per session: Not on file  . Stress: Not on file  Relationships  . Social connections:    Talks on phone: Not on file    Gets together: Not on file    Attends religious service: Not on file    Active member of club or organization: Not on file    Attends meetings of clubs or organizations: Not on file    Relationship status: Not on file  Other Topics Concern  . Not on file  Social History Narrative  . Not on file    Outpatient Encounter Medications as of 10/23/2018  Medication Sig  . levothyroxine (SYNTHROID, LEVOTHROID) 25 MCG tablet Take 1 tablet (25 mcg total) by mouth daily before breakfast.  . losartan (COZAAR) 25 MG tablet Take 2 tablets each am to equal 50mg  once daily   No facility-administered encounter medications on file as of 10/23/2018.  Activities of Daily Living In your present state of health, do you have any difficulty performing the following activities: 10/23/2018  Hearing? N  Vision? N  Difficulty concentrating or making decisions? N  Walking or climbing stairs? N  Dressing or bathing? N  Doing errands, shopping? N  Some recent data might be hidden    Patient Care Team: Julaine Fusi, NP as PCP - General (Family Medicine)    Assessment:   This is a routine wellness examination for Alejandra Alexander.  Exercise Activities and Dietary recommendations   Goals- Continue your excellent regular walking regime  Fall Risk Fall Risk  10/23/2018 05/16/2018  Falls in the past year? 0 No   Is the patient's home free of loose throw rugs in walkways, pet beds, electrical cords, etc?   yes       Grab bars in the bathroom? no      Handrails on the stairs?   yes      Adequate lighting?   yes  Timed Get Up and Go performed: EXCELLENT, 7 seconds  Depression Screen PHQ 2/9 Scores 10/23/2018 07/29/2018 07/10/2018 06/27/2018  PHQ - 2 Score 0 0 0 -  PHQ- 9 Score 0 0 0 -  Exception Documentation - - - Patient refusal     Cognitive Function        Immunization History  Administered Date(s) Administered  . Influenza Whole 09/16/2007, 09/03/2009  . Influenza,inj,Quad PF,6+ Mos 09/09/2015  . Pneumococcal Conjugate-13 09/09/2015  . Pneumococcal Polysaccharide-23 09/06/2005    Qualifies for Shingles Vaccine?yes  Screening Tests Health Maintenance  Topic Date Due  . TETANUS/TDAP  08/31/1959  . DEXA SCAN  08/30/2005  . INFLUENZA VACCINE  06/06/2018  . PNA vac Low Risk Adult  Completed    Cancer Screenings: Lung: Low Dose CT Chest recommended if Age 7-80 years, 30 pack-year currently smoking OR have quit w/in 15years. Patient does not qualify. Breast:  Up to date on Mammogram? Yes   Up to date of Bone Density/Dexa? No Colorectal: N/A  Additional Screenings:  Hepatitis C Screening:      Plan:  Continue to drink plenty of water and continue to follow Heart Healthy Diet. Continue all current medications as directed. Please follow-up in 1 month (come fasting so we can obtain labs) and if blood pressure is still above goal we will increase Losartan dose.  I have personally reviewed and noted the following in the patient's chart:   . Medical and social history . Use of alcohol, tobacco or illicit drugs  . Current medications and supplements . Functional ability and status . Nutritional status . Physical activity . Advanced directives . List of other physicians . Hospitalizations, surgeries, and ER visits in previous 12 months . Vitals . Screenings to include cognitive, depression, and falls . Referrals and appointments  In addition, I have reviewed and discussed with  patient certain preventive protocols, quality metrics, and best practice recommendations. A written personalized care plan for preventive services as well as general preventive health recommendations were provided to patient.     Julaine Fusi, NP  10/23/2018

## 2018-11-19 ENCOUNTER — Telehealth: Payer: Self-pay | Admitting: Adult Health

## 2018-11-19 NOTE — Telephone Encounter (Signed)
Patient is requesting a refill of her blood pressure meds, if approved please send to [facility].  Patient also wanted to let Orpha Bur know that she is taking both BP meds at the same time and has switched from taking them in the evening to the morning.

## 2018-11-19 NOTE — Telephone Encounter (Signed)
LVM for pt to call to discuss.  Pt should only be taking one BP med.  Alejandra Alexander, CMA

## 2018-11-20 ENCOUNTER — Telehealth: Payer: Self-pay | Admitting: Adult Health

## 2018-11-20 NOTE — Telephone Encounter (Signed)
LVM for pt to call to discuss.  T. Goldy Calandra, CMA  

## 2018-11-20 NOTE — Telephone Encounter (Signed)
Patient called back, please contact when available

## 2018-11-21 ENCOUNTER — Other Ambulatory Visit: Payer: Self-pay | Admitting: Adult Health

## 2018-11-21 DIAGNOSIS — I10 Essential (primary) hypertension: Secondary | ICD-10-CM

## 2018-11-22 NOTE — Telephone Encounter (Signed)
Patient came by the office and I spoke with her about her blood pressure medication.  Per patient she is not taking 2 separate blood pressure medications she is only taking the Losartan 25 mg 2 tabs daily.  Patient meant that she is now taking both of the tablets at the same time.  Reviewed her chart and sent in refill to pharmacy per office policy.  Advised the patient to continue taking both tablets at the same time per [facility] instruction.  MPulliam, CMA/RT(R)

## 2018-11-25 NOTE — Progress Notes (Signed)
Subjective:    Patient ID: Alejandra Alexander, female    DOB: Aug 23, 1941, 78 y.o.   MRN: [id number]  HPI:  Alejandra Alexander is here for f/u: HTN She has been tried on several different types of anti-hypternsives Currently on Losartan 50mg  QD (2x 25mg  tablets QD) Home readings SBP 130-170, mean 150 DBP 70-80, HR 70-90 She denies CP/dyspnea/dizziness/palpitations She continues to consistently follow heart healthy diet and walk >4 miles/day She continues to abstain from tobacco/vape/ETOH use  Patient Care Team    Relationship Specialty Notifications Start End  Julaine Fusi, NP PCP - General Family Medicine  05/16/18     Patient Active Problem List   Diagnosis Date Noted  . Hypertension 05/16/2018  . Healthcare maintenance 05/16/2018  . Thyroid disease 10/02/2013  . Allergic rhinitis 10/02/2013  . Left thyroid nodule 09/30/2013     Past Medical History:  Diagnosis Date  . Allergy    seasonal  . Anemia   . Chickenpox   . Hypertension   . Thyroid disease      History reviewed. No pertinent surgical history.   Family History  Problem Relation Age of Onset  . Heart disease Father   . Stroke Father      Social History   Substance and Sexual Activity  Drug Use No     Social History   Substance and Sexual Activity  Alcohol Use No     Social History   Tobacco Use  Smoking Status Never Smoker  Smokeless Tobacco Never Used     Outpatient Encounter Medications as of 11/26/2018  Medication Sig  . levothyroxine (SYNTHROID, LEVOTHROID) 25 MCG tablet Take 1 tablet (25 mcg total) by mouth daily before breakfast.  . losartan (COZAAR) 100 MG tablet 1 tablet by mouth daily  . [DISCONTINUED] losartan (COZAAR) 25 MG tablet TAKE 2 TABLETS (50 MG TOTAL) BY MOUTH EVERY MORNING   No facility-administered encounter medications on file as of 11/26/2018.     Allergies: Lisinopril  Body mass index is 23.16 kg/m.  Blood pressure (!) 172/70, pulse 89, temperature 97.7 F  (36.5 C), temperature source Oral, height 5\' 2"  (1.575 m), weight 126 lb 9.6 oz (57.4 kg), SpO2 100 %.  Review of Systems  Constitutional: Negative for activity change, appetite change, chills, diaphoresis, fatigue, fever and unexpected weight change.  Respiratory: Negative for cough, chest tightness, shortness of breath, wheezing and stridor.   Cardiovascular: Negative for chest pain, palpitations and leg swelling.  Neurological: Negative for dizziness, weakness and headaches.  Hematological: Does not bruise/bleed easily.  Psychiatric/Behavioral: Negative for sleep disturbance.       Objective:   Physical Exam Vitals signs and nursing note reviewed.  Constitutional:      General: She is not in acute distress.    Appearance: She is not ill-appearing, toxic-appearing or diaphoretic.  HENT:     Head: Normocephalic and atraumatic.  Eyes:     General: No scleral icterus.       Right eye: No discharge.        Left eye: No discharge.     Extraocular Movements: Extraocular movements intact.     Conjunctiva/sclera: Conjunctivae normal.     Pupils: Pupils are equal, round, and reactive to light.  Cardiovascular:     Rate and Rhythm: Normal rate.     Pulses: Normal pulses.     Heart sounds: Normal heart sounds. No murmur. No friction rub. No gallop.   Pulmonary:     Effort: Pulmonary effort is  normal. No respiratory distress.     Breath sounds: Normal breath sounds. No stridor. No wheezing, rhonchi or rales.  Chest:     Chest wall: No tenderness.  Skin:    General: Skin is warm.     Capillary Refill: Capillary refill takes less than 2 seconds.  Neurological:     Mental Status: She is alert and oriented to person, place, and time.  Psychiatric:        Mood and Affect: Mood normal.        Behavior: Behavior normal.        Thought Content: Thought content normal.        Judgment: Judgment normal.           Assessment & Plan:   1. Hypertension, unspecified type      Hypertension Both BP checks well above goal Home BP readings: SBP 130-170, mean 150 DBP 70-80, HR 70-90 Since your blood pressure is above goal, increase daily losartan to 100mg  (4 x 25mg  tablets- at the same time). Continue to walk daily and follow a heart healthy diet. Continue to check your blood pressure and heart rate daily- please call clinic in 2 weeks with readings.    FOLLOW-UP:  No follow-ups on file.

## 2018-11-26 ENCOUNTER — Ambulatory Visit (INDEPENDENT_AMBULATORY_CARE_PROVIDER_SITE_OTHER): Payer: Medicare Other | Admitting: Adult Health

## 2018-11-26 ENCOUNTER — Encounter: Payer: Self-pay | Admitting: Adult Health

## 2018-11-26 DIAGNOSIS — I10 Essential (primary) hypertension: Secondary | ICD-10-CM | POA: Diagnosis not present

## 2018-11-26 MED ORDER — LOSARTAN POTASSIUM 100 MG PO TABS: ORAL_TABLET | ORAL | 1 refills | Status: DC

## 2018-11-26 NOTE — Assessment & Plan Note (Signed)
Both BP checks well above goal Home BP readings: SBP 130-170, mean 150 DBP 70-80, HR 70-90 Since your blood pressure is above goal, increase daily losartan to 100mg  (4 x 25mg  tablets- at the same time). Continue to walk daily and follow a heart healthy diet. Continue to check your blood pressure and heart rate daily- please call clinic in 2 weeks with readings.

## 2018-11-26 NOTE — Patient Instructions (Signed)
Hypertension Hypertension is another name for high blood pressure. High blood pressure forces your heart to work harder to pump blood. This can cause problems over time. There are two numbers in a blood pressure reading. There is a top number (systolic) over a bottom number (diastolic). It is best to have a blood pressure below 120/80. Healthy choices can help lower your blood pressure. You may need medicine to help lower your blood pressure if:  Your blood pressure cannot be lowered with healthy choices.  Your blood pressure is higher than 130/80. Follow these instructions at home: Eating and drinking   If directed, follow the DASH eating plan. This diet includes: ? Filling half of your plate at each meal with fruits and vegetables. ? Filling one quarter of your plate at each meal with whole grains. Whole grains include whole wheat pasta, brown rice, and whole grain bread. ? Eating or drinking low-fat dairy products, such as skim milk or low-fat yogurt. ? Filling one quarter of your plate at each meal with low-fat (lean) proteins. Low-fat proteins include fish, skinless chicken, eggs, beans, and tofu. ? Avoiding fatty meat, cured and processed meat, or chicken with skin. ? Avoiding premade or processed food.  Eat less than 1,500 mg of salt (sodium) a day.  Limit alcohol use to no more than 1 drink a day for nonpregnant women and 2 drinks a day for men. One drink equals 12 oz of beer, 5 oz of wine, or 1 oz of hard liquor. Lifestyle  Work with your doctor to stay at a healthy weight or to lose weight. Ask your doctor what the best weight is for you.  Get at least 30 minutes of exercise that causes your heart to beat faster (aerobic exercise) most days of the week. This may include walking, swimming, or biking.  Get at least 30 minutes of exercise that strengthens your muscles (resistance exercise) at least 3 days a week. This may include lifting weights or pilates.  Do not use any  products that contain nicotine or tobacco. This includes cigarettes and e-cigarettes. If you need help quitting, ask your doctor.  Check your blood pressure at home as told by your doctor.  Keep all follow-up visits as told by your doctor. This is important. Medicines  Take over-the-counter and prescription medicines only as told by your doctor. Follow directions carefully.  Do not skip doses of blood pressure medicine. The medicine does not work as well if you skip doses. Skipping doses also puts you at risk for problems.  Ask your doctor about side effects or reactions to medicines that you should watch for. Contact a doctor if:  You think you are having a reaction to the medicine you are taking.  You have headaches that keep coming back (recurring).  You feel dizzy.  You have swelling in your ankles.  You have trouble with your vision. Get help right away if:  You get a very bad headache.  You start to feel confused.  You feel weak or numb.  You feel faint.  You get very bad pain in your: ? Chest. ? Belly (abdomen).  You throw up (vomit) more than once.  You have trouble breathing. Summary  Hypertension is another name for high blood pressure.  Making healthy choices can help lower blood pressure. If your blood pressure cannot be controlled with healthy choices, you may need to take medicine. This information is not intended to replace advice given to you by your health care   provider. Make sure you discuss any questions you have with your health care provider. Document Released: 04/10/2008 Document Revised: 09/20/2016 Document Reviewed: 09/20/2016 Elsevier Interactive Patient Education  2019 [employer].  Since your blood pressure is above goal, increase daily losartan to 100mg  (4 x 25mg  tablets- at the same time). Continue to walk daily and follow a heart healthy diet. Continue to check your blood pressure and heart rate daily- please call clinic in 2 weeks  with readings. NICE TO SEE YOU!

## 2018-12-25 NOTE — Progress Notes (Signed)
Subjective:    Patient ID: Alejandra Alexander, female    DOB: 10/09/1941, 78 y.o.   MRN: [id number]  HPI:  Alejandra Alexander is here for 6 month f/u: HTN She reports medication compliance, denies SE She is taking Losartan 100mg  QD to control BP She continues to walk daily and strictly follow heart healthy diet. She continues to abstain from tobacco/vape/ETOH use She has previously been trialed on Lisinopril, Amlodipine, and lower dose Losartan. She is not obese and does not have lower extremity edema, diuretic not appropriate   BP continues to be well above goal. Home readings- SBP: 140-170 DBP: 70-80 She denies CP/dyspnea/dizziness/palpitations She denies change in vision/HAs  She is finally agreeable to referral to Cardiology/HTN Clinic   Patient Care Team    Relationship Specialty Notifications Start End  Alejandra Fusi, NP PCP - General Family Medicine  05/16/18     Patient Active Problem List   Diagnosis Date Noted  . Hypertension 05/16/2018  . Healthcare maintenance 05/16/2018  . Thyroid disease 10/02/2013  . Allergic rhinitis 10/02/2013  . Left thyroid nodule 09/30/2013     Past Medical History:  Diagnosis Date  . Allergy    seasonal  . Anemia   . Chickenpox   . Hypertension   . Thyroid disease      History reviewed. No pertinent surgical history.   Family History  Problem Relation Age of Onset  . Heart disease Father   . Stroke Father      Social History   Substance and Sexual Activity  Drug Use No     Social History   Substance and Sexual Activity  Alcohol Use No     Social History   Tobacco Use  Smoking Status Never Smoker  Smokeless Tobacco Never Used     Outpatient Encounter Medications as of 12/30/2018  Medication Sig  . levothyroxine (SYNTHROID, LEVOTHROID) 25 MCG tablet Take 1 tablet (25 mcg total) by mouth daily before breakfast.  . losartan (COZAAR) 100 MG tablet 1 tablet by mouth daily   No facility-administered encounter  medications on file as of 12/30/2018.     Allergies: Lisinopril  Body mass index is 23.34 kg/m.  Blood pressure (!) 172/73, pulse 74, temperature 97.9 F (36.6 C), temperature source Oral, height 5\' 2"  (1.575 m), weight 127 lb 9.6 oz (57.9 kg), SpO2 99 %.  Review of Systems  Constitutional: Positive for fatigue. Negative for activity change, appetite change, chills, diaphoresis, fever and unexpected weight change.  HENT: Negative for congestion.   Eyes: Negative for visual disturbance.  Respiratory: Negative for cough, chest tightness, shortness of breath, wheezing and stridor.   Cardiovascular: Negative for chest pain, palpitations and leg swelling.  Gastrointestinal: Negative for abdominal distention, anal bleeding, blood in stool, constipation, diarrhea, nausea and vomiting.  Endocrine: Negative for cold intolerance, heat intolerance, polydipsia, polyphagia and polyuria.  Genitourinary: Negative for difficulty urinating and flank pain.  Musculoskeletal: Negative for arthralgias, back pain, gait problem, joint swelling, myalgias, neck pain and neck stiffness.  Skin: Negative for color change, pallor, rash and wound.  Neurological: Negative for dizziness, tremors, weakness and headaches.  Hematological: Does not bruise/bleed easily.  Psychiatric/Behavioral: Negative for agitation, behavioral problems, confusion, decreased concentration, dysphoric mood, hallucinations, self-injury, sleep disturbance and suicidal ideas. The patient is not nervous/anxious and is not hyperactive.        Objective:   Physical Exam Vitals signs and nursing note reviewed.  Constitutional:      General: She is not in  acute distress.    Appearance: Normal appearance. She is normal weight. She is not ill-appearing, toxic-appearing or diaphoretic.  HENT:     Head: Normocephalic and atraumatic.     Right Ear: Decreased hearing noted.     Left Ear: Decreased hearing noted.  Cardiovascular:     Rate and  Rhythm: Normal rate.     Pulses: Normal pulses.     Heart sounds: Normal heart sounds. No murmur. No friction rub. No gallop.   Pulmonary:     Effort: Pulmonary effort is normal. No respiratory distress.     Breath sounds: No wheezing or rhonchi.  Chest:     Chest wall: No tenderness.  Skin:    General: Skin is warm and dry.     Capillary Refill: Capillary refill takes less than 2 seconds.  Neurological:     Mental Status: She is alert and oriented to person, place, and time.  Psychiatric:        Mood and Affect: Mood normal.        Behavior: Behavior normal.        Thought Content: Thought content normal.        Judgment: Judgment normal.       Assessment & Plan:   1. Hypertension, unspecified type     Hypertension BP in clinic and at home remains well above goal Has been tried on Lisinopril, Amlodipine, and lower dose Losartan. She is not obese and does not have lower extremity edema, diuretic not appropriate  Continue all medications as directed. Continue to drink plenty of water and follow heart healthy diet. Referral to Cardiology/[facility] placed. Please schedule fasting lab appt tomorrow- only water and black coffee permitted in morning, fasting from everything else for at least 8 hrs. Continue to check your blood pressure and heart rate daly, take log with your to Cardiology appt. Follow up here in 3 months for Medicare Wellness visit.    FOLLOW-UP:  Return in about 3 months (around 03/30/2019) for Medical Wellness.

## 2018-12-30 ENCOUNTER — Encounter: Payer: Self-pay | Admitting: Adult Health

## 2018-12-30 ENCOUNTER — Ambulatory Visit (INDEPENDENT_AMBULATORY_CARE_PROVIDER_SITE_OTHER): Payer: Medicare Other | Admitting: Adult Health

## 2018-12-30 VITALS — BP 172/73 | HR 74 | Temp 97.9°F | Ht 62.0 in | Wt 127.6 lb

## 2018-12-30 DIAGNOSIS — I10 Essential (primary) hypertension: Secondary | ICD-10-CM | POA: Diagnosis not present

## 2018-12-30 NOTE — Assessment & Plan Note (Signed)
BP in clinic and at home remains well above goal Has been tried on Lisinopril, Amlodipine, and lower dose Losartan. She is not obese and does not have lower extremity edema, diuretic not appropriate  Continue all medications as directed. Continue to drink plenty of water and follow heart healthy diet. Referral to Cardiology/[facility] placed. Please schedule fasting lab appt tomorrow- only water and black coffee permitted in morning, fasting from everything else for at least 8 hrs. Continue to check your blood pressure and heart rate daly, take log with your to Cardiology appt. Follow up here in 3 months for Medicare Wellness visit.

## 2018-12-30 NOTE — Patient Instructions (Addendum)
Managing Your Hypertension Hypertension is commonly called high blood pressure. This is when the force of your blood pressing against the walls of your arteries is too strong. Arteries are blood vessels that carry blood from your heart throughout your body. Hypertension forces the heart to work harder to pump blood, and may cause the arteries to become narrow or stiff. Having untreated or uncontrolled hypertension can cause heart attack, stroke, kidney disease, and other problems. What are blood pressure readings? A blood pressure reading consists of a higher number over a lower number. Ideally, your blood pressure should be below 120/80. The first ("top") number is called the systolic pressure. It is a measure of the pressure in your arteries as your heart beats. The second ("bottom") number is called the diastolic pressure. It is a measure of the pressure in your arteries as the heart relaxes. What does my blood pressure reading mean? Blood pressure is classified into four stages. Based on your blood pressure reading, your health care provider may use the following stages to determine what type of treatment you need, if any. Systolic pressure and diastolic pressure are measured in a unit called mm Hg. Normal  Systolic pressure: below 120.  Diastolic pressure: below 80. Elevated  Systolic pressure: 120-129.  Diastolic pressure: below 80. Hypertension stage 1  Systolic pressure: 130-139.  Diastolic pressure: 80-89. Hypertension stage 2  Systolic pressure: 140 or above.  Diastolic pressure: 90 or above. What health risks are associated with hypertension? Managing your hypertension is an important responsibility. Uncontrolled hypertension can lead to:  A heart attack.  A stroke.  A weakened blood vessel (aneurysm).  Heart failure.  Kidney damage.  Eye damage.  Metabolic syndrome.  Memory and concentration problems. What changes can I make to manage my  hypertension? Hypertension can be managed by making lifestyle changes and possibly by taking medicines. Your health care provider will help you make a plan to bring your blood pressure within a normal range. Eating and drinking   Eat a diet that is high in fiber and potassium, and low in salt (sodium), added sugar, and fat. An example eating plan is called the DASH (Dietary Approaches to Stop Hypertension) diet. To eat this way: ? Eat plenty of fresh fruits and vegetables. Try to fill half of your plate at each meal with fruits and vegetables. ? Eat whole grains, such as whole wheat pasta, brown rice, or whole grain bread. Fill about one quarter of your plate with whole grains. ? Eat low-fat diary products. ? Avoid fatty cuts of meat, processed or cured meats, and poultry with skin. Fill about one quarter of your plate with lean proteins such as fish, chicken without skin, beans, eggs, and tofu. ? Avoid premade and processed foods. These tend to be higher in sodium, added sugar, and fat.  Reduce your daily sodium intake. Most people with hypertension should eat less than 1,500 mg of sodium a day.  Limit alcohol intake to no more than 1 drink a day for nonpregnant women and 2 drinks a day for men. One drink equals 12 oz of beer, 5 oz of wine, or 1 oz of hard liquor. Lifestyle  Work with your health care provider to maintain a healthy body weight, or to lose weight. Ask what an ideal weight is for you.  Get at least 30 minutes of exercise that causes your heart to beat faster (aerobic exercise) most days of the week. Activities may include walking, swimming, or biking.  Include exercise   to strengthen your muscles (resistance exercise), such as weight lifting, as part of your weekly exercise routine. Try to do these types of exercises for 30 minutes at least 3 days a week.  Do not use any products that contain nicotine or tobacco, such as cigarettes and e-cigarettes. If you need help quitting,  ask your health care provider.  Control any long-term (chronic) conditions you have, such as high cholesterol or diabetes. Monitoring  Monitor your blood pressure at home as told by your health care provider. Your personal target blood pressure may vary depending on your medical conditions, your age, and other factors.  Have your blood pressure checked regularly, as often as told by your health care provider. Working with your health care provider  Review all the medicines you take with your health care provider because there may be side effects or interactions.  Talk with your health care provider about your diet, exercise habits, and other lifestyle factors that may be contributing to hypertension.  Visit your health care provider regularly. Your health care provider can help you create and adjust your plan for managing hypertension. Will I need medicine to control my blood pressure? Your health care provider may prescribe medicine if lifestyle changes are not enough to get your blood pressure under control, and if:  Your systolic blood pressure is 130 or higher.  Your diastolic blood pressure is 80 or higher. Take medicines only as told by your health care provider. Follow the directions carefully. Blood pressure medicines must be taken as prescribed. The medicine does not work as well when you skip doses. Skipping doses also puts you at risk for problems. Contact a health care provider if:  You think you are having a reaction to medicines you have taken.  You have repeated (recurrent) headaches.  You feel dizzy.  You have swelling in your ankles.  You have trouble with your vision. Get help right away if:  You develop a severe headache or confusion.  You have unusual weakness or numbness, or you feel faint.  You have severe pain in your chest or abdomen.  You vomit repeatedly.  You have trouble breathing. Summary  Hypertension is when the force of blood pumping  through your arteries is too strong. If this condition is not controlled, it may put you at risk for serious complications.  Your personal target blood pressure may vary depending on your medical conditions, your age, and other factors. For most people, a normal blood pressure is less than 120/80.  Hypertension is managed by lifestyle changes, medicines, or both. Lifestyle changes include weight loss, eating a healthy, low-sodium diet, exercising more, and limiting alcohol. This information is not intended to replace advice given to you by your health care provider. Make sure you discuss any questions you have with your health care provider. Document Released: 07/17/2012 Document Revised: 09/20/2016 Document Reviewed: 09/20/2016 Elsevier Interactive Patient Education  2019 [employer].  Continue all medications as directed. Continue to drink plenty of water and follow heart healthy diet. Referral to Cardiology/[facility] placed. Please schedule fasting lab appt tomorrow- only water and black coffee permitted in morning, fasting from everything else for at least 8 hrs. Continue to check your blood pressure and heart rate daly, take log with your to Cardiology appt. Follow up here in 3 months for Medicare Wellness visit. GREAT TO SEE YOU!

## 2018-12-31 ENCOUNTER — Other Ambulatory Visit: Payer: Medicare Other

## 2018-12-31 DIAGNOSIS — E079 Disorder of thyroid, unspecified: Secondary | ICD-10-CM

## 2018-12-31 DIAGNOSIS — Z0000 Encounter for general adult medical examination without abnormal findings: Secondary | ICD-10-CM

## 2018-12-31 DIAGNOSIS — I10 Essential (primary) hypertension: Secondary | ICD-10-CM

## 2019-01-01 LAB — CBC WITH DIFFERENTIAL/PLATELET
BASOS ABS: 0 10*3/uL (ref 0.0–0.2)
Basos: 0 %
EOS (ABSOLUTE): 0.1 10*3/uL (ref 0.0–0.4)
Eos: 2 %
Hematocrit: 40.5 % (ref 34.0–46.6)
Hemoglobin: 13.3 g/dL (ref 11.1–15.9)
Immature Grans (Abs): 0 10*3/uL (ref 0.0–0.1)
Immature Granulocytes: 0 %
LYMPHS ABS: 1.9 10*3/uL (ref 0.7–3.1)
Lymphs: 37 %
MCH: 29 pg (ref 26.6–33.0)
MCHC: 32.8 g/dL (ref 31.5–35.7)
MCV: 88 fL (ref 79–97)
MONOCYTES: 9 %
Monocytes Absolute: 0.5 10*3/uL (ref 0.1–0.9)
NEUTROS ABS: 2.6 10*3/uL (ref 1.4–7.0)
Neutrophils: 52 %
PLATELETS: 261 10*3/uL (ref 150–450)
RBC: 4.59 x10E6/uL (ref 3.77–5.28)
RDW: 13 % (ref 11.7–15.4)
WBC: 5.1 10*3/uL (ref 3.4–10.8)

## 2019-01-01 LAB — COMPREHENSIVE METABOLIC PANEL
ALK PHOS: 93 IU/L (ref 39–117)
ALT: 41 IU/L — AB (ref 0–32)
AST: 45 IU/L — ABNORMAL HIGH (ref 0–40)
Albumin/Globulin Ratio: 2.4 — ABNORMAL HIGH (ref 1.2–2.2)
Albumin: 4.6 g/dL (ref 3.7–4.7)
BILIRUBIN TOTAL: 0.6 mg/dL (ref 0.0–1.2)
BUN / CREAT RATIO: 19 (ref 12–28)
BUN: 15 mg/dL (ref 8–27)
CHLORIDE: 106 mmol/L (ref 96–106)
CO2: 23 mmol/L (ref 20–29)
Calcium: 9.3 mg/dL (ref 8.7–10.3)
Creatinine, Ser: 0.79 mg/dL (ref 0.57–1.00)
GFR calc non Af Amer: 72 mL/min/{1.73_m2} (ref >59)
GFR, EST AFRICAN AMERICAN: 83 mL/min/{1.73_m2} (ref >59)
GLUCOSE: 87 mg/dL (ref 65–99)
Globulin, Total: 1.9 g/dL (ref 1.5–4.5)
POTASSIUM: 4 mmol/L (ref 3.5–5.2)
Sodium: 143 mmol/L (ref 134–144)
TOTAL PROTEIN: 6.5 g/dL (ref 6.0–8.5)

## 2019-01-01 LAB — LIPID PANEL
CHOL/HDL RATIO: 3.5 ratio (ref 0.0–4.4)
Cholesterol, Total: 227 mg/dL — ABNORMAL HIGH (ref 100–199)
HDL: 65 mg/dL (ref >39)
LDL CALC: 143 mg/dL — AB (ref 0–99)
Triglycerides: 97 mg/dL (ref 0–149)
VLDL CHOLESTEROL CAL: 19 mg/dL (ref 5–40)

## 2019-01-01 LAB — HEMOGLOBIN A1C
ESTIMATED AVERAGE GLUCOSE: 108 mg/dL
Hgb A1c MFr Bld: 5.4 % (ref 4.8–5.6)

## 2019-01-01 LAB — TSH: TSH: 4.1 u[IU]/mL (ref 0.450–4.500)

## 2019-01-07 ENCOUNTER — Telehealth: Payer: Self-pay | Admitting: Adult Health

## 2019-01-07 NOTE — Telephone Encounter (Signed)
Patient came by office to request refill on :   losartan (COZAAR) 100 MG tablet [[id number]]   Order Details  Dose, Route, Frequency: As Directed   Dispense Quantity: 90 tablet Refills: 1 Fills remaining: --        Sig: 1 tablet by mouth daily          Forwarding request to medical assistant to send refill order to :   [facility] - [place], [place] - [address] [phone] (Phone) [phone] (Fax)   --glh

## 2019-01-08 ENCOUNTER — Other Ambulatory Visit: Payer: Self-pay | Admitting: Adult Health

## 2019-01-08 DIAGNOSIS — I10 Essential (primary) hypertension: Secondary | ICD-10-CM

## 2019-01-08 NOTE — Telephone Encounter (Signed)
LVM for pt to call to discuss.  Medication refilled 11/26/18 for #90 with 1 refill.  Tiajuana Amass, CMA

## 2019-01-08 NOTE — Telephone Encounter (Signed)
Pt informed.  Pt expressed understanding.  T. Nelson, CMA 

## 2019-01-09 ENCOUNTER — Telehealth: Payer: Self-pay | Admitting: Adult Health

## 2019-01-09 NOTE — Telephone Encounter (Signed)
Afternoon Tonya, Can you please tell Ms. Kapoor to follow manufacturer's directions on how Fish Oil to take, should be listed on bottle. Thanks! Orpha Bur

## 2019-01-09 NOTE — Telephone Encounter (Signed)
Patient stopped into office asking should she take OTC fish oil and if so she wants to know how much, please advise and contact patient on home number. She states if she does not pick up the phone to leave a message please

## 2019-01-09 NOTE — Telephone Encounter (Signed)
Pt informed.  Pt expressed understanding and is agreeable.  T. Nelson, CMA  

## 2019-01-09 NOTE — Telephone Encounter (Signed)
Please advise.  T. Nelson, CMA 

## 2019-01-17 ENCOUNTER — Other Ambulatory Visit: Payer: Medicare Other

## 2019-01-21 ENCOUNTER — Telehealth: Payer: Self-pay

## 2019-01-21 NOTE — Telephone Encounter (Signed)
Left message of MD request to reschedule.

## 2019-01-22 ENCOUNTER — Telehealth: Payer: Self-pay | Admitting: Adult Health

## 2019-01-22 ENCOUNTER — Ambulatory Visit: Payer: Medicare Other | Admitting: Cardiovascular Disease

## 2019-01-22 NOTE — Telephone Encounter (Signed)
Patient called request to speak w/ provider/ nurse --advised Katy in clinic & would send message to medical assistant to call her at 1st available moment-- Pt states will be at home all day.  --[facility]

## 2019-01-22 NOTE — Telephone Encounter (Signed)
Pt called stating that the hypertension clinic called and cancelled her appt d/t COVID-19 and she is unsure when they will reschedule her appt.  Pt is requesting Orpha Bur add an additional anti-hypertensive to help control her BPs until she is seen in the HTN clinic.  Pt states that her BPs have been running 151/70-80s.  Advised pt that this is not a dangerously high BP and that Orpha Bur wishes for her to wait until she is seen by the HTN clinic for any medication changes.  Pt expressed understanding and is agreeable.  Tiajuana Amass, CMA

## 2019-01-27 ENCOUNTER — Telehealth: Payer: Self-pay | Admitting: Cardiovascular Disease

## 2019-01-27 NOTE — Telephone Encounter (Signed)
Daughter of Pt wanted to know if she can be evaluated over the phone instead of coming into the office.

## 2019-01-29 NOTE — Telephone Encounter (Signed)
Left message for pt dtr, aware she will need to be seen. Ask dtr to call and reschedule out for 3 months if blood pressure allows.

## 2019-01-29 NOTE — Telephone Encounter (Signed)
New message   Patient's daughter would like a call back to discuss the prior message.

## 2019-01-29 NOTE — Telephone Encounter (Signed)
Hard to evaluate hypertension by telemedicine.  If this is not an urgent consult I can see back in 3 months in the office.

## 2019-01-29 NOTE — Telephone Encounter (Signed)
Spoke with pt dtr, she would like to keep the appointment because the bp is not currently controlled.

## 2019-01-30 NOTE — Telephone Encounter (Signed)
Spoke with pt dtr, appointment will be keep as scheduled per dr berry.

## 2019-02-13 ENCOUNTER — Telehealth: Payer: Self-pay | Admitting: *Deleted

## 2019-02-13 NOTE — Telephone Encounter (Signed)
LVM for patient to call back. ?

## 2019-02-18 ENCOUNTER — Ambulatory Visit: Payer: Medicare Other | Admitting: Cardiovascular Disease

## 2019-05-16 ENCOUNTER — Other Ambulatory Visit: Payer: Self-pay | Admitting: Adult Health

## 2019-05-19 ENCOUNTER — Telehealth: Payer: Self-pay

## 2019-05-19 NOTE — Telephone Encounter (Signed)
Please call this pt to schedule Medicare Wellness exam via telephone.  She was due for this in 03/2019.  Charyl Bigger, CMA

## 2019-05-20 ENCOUNTER — Telehealth: Payer: Self-pay | Admitting: Adult Health

## 2019-05-20 NOTE — Telephone Encounter (Signed)
Left patient message to contact office regarding provider required Medicare Wellness.  ----Juluis Rainier to medical assistant.---glh

## 2019-06-24 ENCOUNTER — Other Ambulatory Visit: Payer: Self-pay

## 2019-06-24 ENCOUNTER — Ambulatory Visit (INDEPENDENT_AMBULATORY_CARE_PROVIDER_SITE_OTHER): Payer: Medicare Other | Admitting: Cardiovascular Disease

## 2019-06-24 ENCOUNTER — Encounter: Payer: Self-pay | Admitting: Cardiovascular Disease

## 2019-06-24 DIAGNOSIS — I10 Essential (primary) hypertension: Secondary | ICD-10-CM | POA: Diagnosis not present

## 2019-06-24 DIAGNOSIS — E785 Hyperlipidemia, unspecified: Secondary | ICD-10-CM | POA: Insufficient documentation

## 2019-06-24 DIAGNOSIS — E782 Mixed hyperlipidemia: Secondary | ICD-10-CM | POA: Diagnosis not present

## 2019-06-24 NOTE — Progress Notes (Signed)
06/24/2019 Alejandra Alexander   Jul 14, 1941  [id number]  Primary Physician Danford, Jinny BlossomKaty D, NP Primary Cardiologist: Alejandra GessJonathan J  MD Alejandra CalamityFACP, FACC, FAHA, [place]  HPI:  Alejandra Alexander is a 78 y.o.   thin appearing widowed Caucasian female mother of 1 daughter referred by Alejandra HamburgerKaty Danford NP for evaluation of hypertension.  She has no other cardiac risk factors.  She is never had a heart attack or stroke.  She denies chest pain or shortness of breath.  She was only recently diagnosed with hypertension and is on losartan.  She is very active and runs 4 miles a day.   Current Meds  Medication Sig  . levothyroxine (SYNTHROID) 25 MCG tablet TAKE 1 TABLET (25 MCG TOTAL) BY MOUTH DAILY BEFORE BREAKFAST.  Marland Kitchen. losartan (COZAAR) 100 MG tablet 1 tablet by mouth daily     Allergies  Allergen Reactions  . Lisinopril Other (See Comments)    Weakness, visual disturbances    Social History   Socioeconomic History  . Marital status: Widowed    Spouse name: Not on file  . Number of children: Not on file  . Years of education: Not on file  . Highest education level: Not on file  Occupational History  . Not on file  Social Needs  . Financial resource strain: Not on file  . Food insecurity    Worry: Not on file    Inability: Not on file  . Transportation needs    Medical: Not on file    Non-medical: Not on file  Tobacco Use  . Smoking status: Never Smoker  . Smokeless tobacco: Never Used  Substance and Sexual Activity  . Alcohol use: No  . Drug use: No  . Sexual activity: Not Currently  Lifestyle  . Physical activity    Days per week: Not on file    Minutes per session: Not on file  . Stress: Not on file  Relationships  . Social Musicianconnections    Talks on phone: Not on file    Gets together: Not on file    Attends religious service: Not on file    Active member of club or organization: Not on file    Attends meetings of clubs or organizations: Not on file    Relationship status: Not  on file  . Intimate partner violence    Fear of current or ex partner: Not on file    Emotionally abused: Not on file    Physically abused: Not on file    Forced sexual activity: Not on file  Other Topics Concern  . Not on file  Social History Narrative  . Not on file     Review of Systems: General: negative for chills, fever, night sweats or weight changes.  Cardiovascular: negative for chest pain, dyspnea on exertion, edema, orthopnea, palpitations, paroxysmal nocturnal dyspnea or shortness of breath Dermatological: negative for rash Respiratory: negative for cough or wheezing Urologic: negative for hematuria Abdominal: negative for nausea, vomiting, diarrhea, bright red blood per rectum, melena, or hematemesis Neurologic: negative for visual changes, syncope, or dizziness All other systems reviewed and are otherwise negative except as noted above.    Blood pressure (!) 194/85, pulse 86, height 5\' 2"  (1.575 m), weight 124 lb 12.8 oz (56.6 kg), SpO2 96 %.  General appearance: alert and no distress Neck: no adenopathy, no carotid bruit, no JVD, supple, symmetrical, trachea midline and thyroid not enlarged, symmetric, no tenderness/mass/nodules Lungs: clear to auscultation bilaterally Heart: regular rate  and rhythm, S1, S2 normal, no murmur, click, rub or gallop Extremities: extremities normal, atraumatic, no cyanosis or edema Pulses: 2+ and symmetric Skin: Skin color, texture, turgor normal. No rashes or lesions Neurologic: Alert and oriented X 3, normal strength and tone. Normal symmetric reflexes. Normal coordination and gait  EKG normal sinus rhythm at 86 without ST or T wave changes.  I personally reviewed this EKG.  ASSESSMENT AND PLAN:   Hypertension History of essential potential blood pressure measured today at 194/85.  She is on losartan.  Her blood pressure readings at home however are much better than this.  I have asked her to keep a blood pressure log daily for the  next month.  Cyril Mourning will call in 4 weeks to review make appropriate pharmacologic changes.  Hyperlipidemia Recent lipid profile performed 12/31/2018 revealed total cholesterol 227, LDL 143 and HDL 65.  At this point, I do not feel compelled to begin her on a statin drug but wanted to defer to her primary care provider.      Lorretta Harp MD FACP,FACC,FAHA, [facility] 06/24/2019 12:43 PM

## 2019-06-24 NOTE — Assessment & Plan Note (Signed)
Recent lipid profile performed 12/31/2018 revealed total cholesterol 227, LDL 143 and HDL 65.  At this point, I do not feel compelled to begin her on a statin drug but wanted to defer to her primary care provider.

## 2019-06-24 NOTE — Assessment & Plan Note (Signed)
History of essential potential blood pressure measured today at 194/85.  She is on losartan.  Her blood pressure readings at home however are much better than this.  I have asked her to keep a blood pressure log daily for the next month.  Cyril Mourning will call in 4 weeks to review make appropriate pharmacologic changes.

## 2019-06-24 NOTE — Patient Instructions (Signed)
Medication Instructions:  Your physician recommends that you continue on your current medications as directed. Please refer to the Current Medication list given to you today.  If you need a refill on your cardiac medications before your next appointment, please call your pharmacy.   Lab work: NONE If you have labs (blood work) drawn today and your tests are completely normal, you will receive your results only by: Marland Kitchen MyChart Message (if you have MyChart) OR . A paper copy in the mail If you have any lab test that is abnormal or we need to change your treatment, we will call you to review the results.  Testing/Procedures: NONE  Follow-Up: At [facility], you and your health needs are our priority.  As part of our continuing mission to provide you with exceptional heart care, we have created designated Provider Care Teams.  These Care Teams include your primary Cardiologist (physician) and Advanced Practice Providers (APPs -  Physician Assistants and Nurse Practitioners) who all work together to provide you with the care you need, when you need it. . You will need a follow up appointment in 3 months with an APP and in 6 months with Dr. Quay Burow.  Please call our office 2 months in advance to schedule this/each appointment.  You may see Dr. Gwenlyn Found or one of the following Advanced Practice Providers on your designated Care Team:   . Kerin Ransom, PA-C . Daleen Snook Kroeger, PA-C . Sande Rives, PA-C . Almyra Deforest, PA-C . Fabian Sharp, PA-C . Jory Sims, DNP . Rosaria Ferries, PA-C   Any Other Special Instructions Will Be Listed Below (If Applicable). KEEP A BLOOD PRESSURE LOG FOR 30 DAYS THEN FOLLOW UP WITH A CLINICAL PHARMACIST IN THE HYPERTENSION CLINIC. YOU WILL NEED AN APPOINTMENT.

## 2019-06-27 NOTE — Addendum Note (Signed)
Addended by: Crissie Reese on: 06/27/2019 04:03 PM   Modules accepted: Orders

## 2019-07-11 ENCOUNTER — Other Ambulatory Visit: Payer: Self-pay | Admitting: Adult Health

## 2019-07-11 DIAGNOSIS — I10 Essential (primary) hypertension: Secondary | ICD-10-CM

## 2019-07-16 ENCOUNTER — Other Ambulatory Visit: Payer: Self-pay

## 2019-07-16 ENCOUNTER — Telehealth: Payer: Self-pay | Admitting: Adult Health

## 2019-07-16 DIAGNOSIS — I10 Essential (primary) hypertension: Secondary | ICD-10-CM

## 2019-07-16 MED ORDER — LOSARTAN POTASSIUM 100 MG PO TABS
ORAL_TABLET | ORAL | 1 refills | Status: DC
Start: 2019-07-16 — End: 2019-07-17

## 2019-07-16 NOTE — Telephone Encounter (Signed)
Patient is requesting a refill of her losartan, if approved please send to [facility] 

## 2019-07-16 NOTE — Telephone Encounter (Signed)
Advised pt that refills for HTN meds need to come from Dr. Gwenlyn Found since he is treating physician for HTN.  Pt expressed understanding and is agreeable.  Charyl Bigger, CMA

## 2019-07-17 ENCOUNTER — Telehealth: Payer: Self-pay

## 2019-07-17 ENCOUNTER — Other Ambulatory Visit: Payer: Self-pay | Admitting: Cardiovascular Disease

## 2019-07-17 DIAGNOSIS — I10 Essential (primary) hypertension: Secondary | ICD-10-CM

## 2019-07-17 MED ORDER — LOSARTAN POTASSIUM 100 MG PO TABS: ORAL_TABLET | ORAL | 1 refills | Status: DC

## 2019-07-17 NOTE — Telephone Encounter (Signed)
Pharmacy did not receive refill sent yesterday.  Requested Prescriptions   Signed Prescriptions Disp Refills  . losartan (COZAAR) 100 MG tablet 90 tablet 1    Sig: 1 tablet by mouth daily    Authorizing Provider: Lorretta Harp    Ordering User: Raelene Bott, Caisley Baxendale L

## 2019-07-31 ENCOUNTER — Other Ambulatory Visit: Payer: Self-pay

## 2019-07-31 ENCOUNTER — Ambulatory Visit (INDEPENDENT_AMBULATORY_CARE_PROVIDER_SITE_OTHER): Payer: Medicare Other | Admitting: Pharmacist

## 2019-07-31 VITALS — BP 164/76 | HR 96 | Resp 14 | Ht 62.0 in | Wt 125.6 lb

## 2019-07-31 DIAGNOSIS — I10 Essential (primary) hypertension: Secondary | ICD-10-CM

## 2019-07-31 MED ORDER — VALSARTAN 160 MG PO TABS: 160.0000 mg | ORAL_TABLET | Freq: Every day | ORAL | 1 refills | Status: DC

## 2019-07-31 NOTE — Patient Instructions (Addendum)
Return for a follow up appointment in 1 month  Check your blood pressure at home daily (if able) and keep record of the readings.  Take your BP meds as follows: *STOP taking losartan 100mg * *START taking valsartan 160mg  every evening (supper or bedtime)*  * CONTINUE low sodium and low salt diet* * Reduce caffeine in your diet*  Bring all of your meds, your BP cuff and your record of home blood pressures to your next appointment.  Exercise as you're able, try to walk approximately 30 minutes per day.  Keep salt intake to a minimum, especially watch canned and prepared boxed foods.  Eat more fresh fruits and vegetables and fewer canned items.  Avoid eating in fast food restaurants.    HOW TO TAKE YOUR BLOOD PRESSURE: . Rest 5 minutes before taking your blood pressure. .  Don't smoke or drink caffeinated beverages for at least 30 minutes before. . Take your blood pressure before (not after) you eat. . Sit comfortably with your back supported and both feet on the floor (don't cross your legs). . Elevate your arm to heart level on a table or a desk. . Use the proper sized cuff. It should fit smoothly and snugly around your bare upper arm. There should be enough room to slip a fingertip under the cuff. The bottom edge of the cuff should be 1 inch above the crease of the elbow. . Ideally, take 3 measurements at one sitting and record the average.

## 2019-07-31 NOTE — Progress Notes (Signed)
Patient ID: Alejandra Alexander                 DOB: 1941-02-15                      MRN: [id number]     HPI:  Alejandra Alexander is a 78 y.o. female referred by Dr. Gwenlyn Found to HTN clinic. PMH includes hypertension, hyperlipidemia, and thyroid disease.   Patient presents today and denies problems with dizziness, swelling, HA's or shortness of breath.   Current HTN meds:  Losartan 100mg  daily   Previously tried:  Lisinopril - weakness/vision changes Amlodipine 2.5mg  - unknow reaction  BP goal: 130/80  Family History: no significant  Social History: denies tobacco or alcohol use  Diet: mainly home cooked meals, except for [employer] once a month  Exercise: speed walk 30 minutes Monday-Friday  Home BP readings: 34 home BP readings (17 in am and 17 in pm) Average morning readying: 147/78 Average evening readying: 147/80  Wt Readings from Last 3 Encounters:  07/31/19 125 lb 9.6 oz (57 kg)  06/24/19 124 lb 12.8 oz (56.6 kg)  12/30/18 127 lb 9.6 oz (57.9 kg)   BP Readings from Last 3 Encounters:  07/31/19 (!) 164/76  06/24/19 (!) 194/85  12/30/18 (!) 172/73   Pulse Readings from Last 3 Encounters:  07/31/19 96  06/24/19 86  12/30/18 74    Past Medical History:  Diagnosis Date  . Allergy    seasonal  . Anemia   . Chickenpox   . Hypertension   . Thyroid disease     Current Outpatient Medications on File Prior to Visit  Medication Sig Dispense Refill  . levothyroxine (SYNTHROID) 25 MCG tablet TAKE 1 TABLET (25 MCG TOTAL) BY MOUTH DAILY BEFORE BREAKFAST. 90 tablet 0   No current facility-administered medications on file prior to visit.     Allergies  Allergen Reactions  . Lisinopril Other (See Comments)    Weakness, visual disturbances    Blood pressure (!) 164/76, pulse 96, resp. rate 14, height 5\' 2"  (1.575 m), weight 125 lb 9.6 oz (57 kg), SpO2 95 %.  Hypertension Blood pressure remains above goal. Patient had ADR to lisinopril and amlodipine in the past and is  very sensitive to medications and afraid to start 2 medications at one time. Will change losartan 100mg  daily to more selective valsartan 160mg  daily. Plan to follow up in 1 months and initiate chlorthalidone 12.5mg  .We discussed potential need for 2 to 3 medication to properly manage her high blood pressure and patient agreed with plan.     Kemi Gell Rodriguez-Guzman PharmD, BCPS, [place] [id number] 08/06/2019 8:40 AM

## 2019-08-06 ENCOUNTER — Encounter: Payer: Self-pay | Admitting: Pharmacist

## 2019-08-06 NOTE — Assessment & Plan Note (Addendum)
Blood pressure remains above goal. Patient had ADR to lisinopril and amlodipine in the past and is very sensitive to medications and afraid to start 2 medications at one time. Will change losartan 100mg  daily to more selective valsartan 160mg  daily. Plan to follow up in 1 months and initiate chlorthalidone 12.5mg  .We discussed potential need for 2 to 3 medication to properly manage her high blood pressure and patient agreed with plan.

## 2019-08-19 ENCOUNTER — Telehealth: Payer: Self-pay

## 2019-08-19 ENCOUNTER — Other Ambulatory Visit: Payer: Self-pay | Admitting: Adult Health

## 2019-08-19 NOTE — Telephone Encounter (Signed)
Please call pt to schedule Medicare Wellness Exam.  No further refills until pt has visit.  Charyl Bigger, CMA

## 2019-09-02 ENCOUNTER — Other Ambulatory Visit: Payer: Self-pay

## 2019-09-02 ENCOUNTER — Ambulatory Visit (INDEPENDENT_AMBULATORY_CARE_PROVIDER_SITE_OTHER): Payer: Medicare Other | Admitting: Pharmacist Clinician (PhC)/ Clinical Pharmacy Specialist

## 2019-09-02 DIAGNOSIS — I10 Essential (primary) hypertension: Secondary | ICD-10-CM

## 2019-09-02 NOTE — Progress Notes (Signed)
Patient ID: [name]                 DOB: 02-18-1941                      MRN: [id number]     HPI:  Alejandra Alexander is a 78 y.o. female referred by Dr. Gwenlyn Found to HTN clinic. PMH includes hypertension, hyperlipidemia (LDL 143, no medications) , and thyroid disease (on levothyroxine 38mcg).   She was seen by Raquel Rodriguez-Guzman last month and had the losartan switched out for valsartan 160 mg.  The thought was to add chlorthalidone today if she is not yet to goal.    Patient presents today and denies problems with dizziness, swelling, HA's, CP or shortness of breath. She notes a great improvement in her home BP readings since switching to the valsartan.    Current HTN meds:  Valsartan 160 mg qd  Previously tried:  Lisinopril - weakness/vision changes Amlodipine 2.5mg  - doesn't recall   BP goal: 130/80  Family History: 6 brothers, several with hypertension; father died from stroke at 54; mother lived to 18 One daughter, no hypertension  Social History: denies tobacco or alcohol use, some coffee or green tea, not on daily basis  Diet: mainly home cooked meals, except for fast food once every week or two (outing with friend)  Exercise: speed walk 30 minutes [place] BP readings: 45 home BP readings - range 129-144/68-83 Average morning readying: 136/75  - down from 147/78 Average evening readying: 135/75 - down from 147/80  Wt Readings from Last 3 Encounters:  07/31/19 125 lb 9.6 oz (57 kg)  06/24/19 124 lb 12.8 oz (56.6 kg)  12/30/18 127 lb 9.6 oz (57.9 kg)   BP Readings from Last 3 Encounters:  09/02/19 (!) 176/80  07/31/19 (!) 164/76  06/24/19 (!) 194/85   Pulse Readings from Last 3 Encounters:  09/02/19 68  07/31/19 96  06/24/19 86    Past Medical History:  Diagnosis Date  . Allergy    seasonal  . Anemia   . Chickenpox   . Hypertension   . Thyroid disease     Current Outpatient Medications on File Prior to Visit  Medication Sig Dispense  Refill  . montelukast (SINGULAIR) 10 MG tablet Take 10 mg by mouth at bedtime.    Marland Kitchen levothyroxine (SYNTHROID) 25 MCG tablet Take 1 tablet (25 mcg total) by mouth daily before breakfast. OFFICE VISIT REQUIRED PRIOR TO ANY FURTHER REFILLS 30 tablet 0  . valsartan (DIOVAN) 160 MG tablet Take 1 tablet (160 mg total) by mouth daily. To replace losartan 30 tablet 1   No current facility-administered medications on file prior to visit.     Allergies  Allergen Reactions  . Lisinopril Other (See Comments)    Weakness, visual disturbances    Blood pressure (!) 176/80, pulse 68.  Hypertension Patient with essential hypertension, now much improved on valsartan 160 mg daily.  While not at goal of < 403 systolic, patient is doing very well and hesitant to add any more medication.  For now she can continue with current valsartan dose and continue regular home BP checks. She was advised to call should she notice a trend in her home readings to > 474 systolic or have other concerns.    Tommy Medal PharmD CPP [facility] at [facility] [address] [place], [place] [address] [phone]

## 2019-09-02 NOTE — Patient Instructions (Signed)
Return for a a follow up appointment with Alejandra Alexander in November  Your blood pressure today is 176/80  Check your blood pressure at home twice daily and keep record of the readings.  Take your BP meds as follows:  Continue with valsartan 160 mg once daily  Bring all of your meds, your BP cuff/machine and your record of home blood pressures to your next appointment.  Exercise as you're able, try to walk approximately 30 minutes per day.  Keep salt intake to a minimum, especially watch canned and prepared boxed foods.  Eat more fresh fruits and vegetables and fewer canned items.  Avoid eating in fast food restaurants.    HOW TO TAKE YOUR BLOOD PRESSURE: . Rest 5 minutes before taking your blood pressure. .  Don't smoke or drink caffeinated beverages for at least 30 minutes before. . Take your blood pressure before (not after) you eat. . Sit comfortably with your back supported and both feet on the floor (don't cross your legs). . Elevate your arm to heart level on a table or a desk. . Use the proper sized cuff. It should fit smoothly and snugly around your bare upper arm. There should be enough room to slip a fingertip under the cuff. The bottom edge of the cuff should be 1 inch above the crease of the elbow. . Ideally, take 3 measurements at one sitting and record the average.

## 2019-09-03 NOTE — Assessment & Plan Note (Signed)
Patient with essential hypertension, now much improved on valsartan 160 mg daily.  While not at goal of < 492 systolic, patient is doing very well and hesitant to add any more medication.  For now she can continue with current valsartan dose and continue regular home BP checks. She was advised to call should she notice a trend in her home readings to > 010 systolic or have other concerns.

## 2019-09-12 ENCOUNTER — Ambulatory Visit (INDEPENDENT_AMBULATORY_CARE_PROVIDER_SITE_OTHER): Payer: Medicare Other

## 2019-09-12 ENCOUNTER — Other Ambulatory Visit: Payer: Self-pay

## 2019-09-12 DIAGNOSIS — Z23 Encounter for immunization: Secondary | ICD-10-CM

## 2019-09-12 NOTE — Progress Notes (Signed)
Pt here for influenza vaccine.  Screening questionnaire reviewed, VIS provided to patient, and any/all patient questions answered.  T. Nelson, CMA  

## 2019-09-16 NOTE — Progress Notes (Signed)
Virtual Visit via Telephone Note   This visit type was conducted due to national recommendations for restrictions regarding the COVID-19 Pandemic (e.g. social distancing) in an effort to limit this patient's exposure and mitigate transmission in our community.  Due to her co-morbid illnesses, this patient is at least at moderate risk for complications without adequate follow up.  This format is felt to be most appropriate for this patient at this time.  The patient did not have access to video technology/had technical difficulties with video requiring transitioning to audio format only (telephone).  All issues noted in this document were discussed and addressed.  No physical exam could be performed with this format.  Please refer to the patient's chart for her  consent to telehealth for [facility].   Date:  09/17/2019   ID:  Alejandra Alexander, DOB 1941-09-26, MRN [id number]  Patient Location: Home Provider Location: Home  PCP:  Esaw Grandchild, NP  Cardiologist:  Quay Burow, MD  Electrophysiologist:  None   Evaluation Performed:  Follow-Up Visit  Chief Complaint:  HTN  History of Present Illness:    Alejandra Alexander is a 78 y.o. female with HTN and HLD. She was last seen by Dr. Gwenlyn Found 06/24/19 to establish care.  She was very active at that time, running 4 miles. In clinic, she was hypertensive in the 190s. She was seen by our clinical pharmacist on 08/06/19. She has a history of intolerance to lisinopril and amlodipine. Her losartan was switched to valsartan 160 mg daily. She is sensitive to medications and wanted to wait on starting HCTZ.   She presents today for follow up. She is tolerating the valsartan well. Pressure has been 130s/70s. No headache or vision changes. No anginal symptoms. Will check CMP. Lipid panel was also reviewed. It appears Enid Derry NP suggested a statin, but wasn't started. I will start 5 mg crestor. Since she is sensitive to medications, she will take 5 mg  crestor for 1 month, then increase to 10 mg crestor with follow up CMP and fasting lipids 2 months later.   The patient does not have symptoms concerning for COVID-19 infection (fever, chills, cough, or new shortness of breath).    Past Medical History:  Diagnosis Date   Allergy    seasonal   Anemia    Chickenpox    Hypertension    Thyroid disease    No past surgical history on file.   Current Meds  Medication Sig   levothyroxine (SYNTHROID) 25 MCG tablet Take 1 tablet (25 mcg total) by mouth daily before breakfast. OFFICE VISIT REQUIRED PRIOR TO ANY FURTHER REFILLS   montelukast (SINGULAIR) 10 MG tablet Take 10 mg by mouth at bedtime.   valsartan (DIOVAN) 160 MG tablet Take 1 tablet (160 mg total) by mouth daily. To replace losartan     Allergies:   Lisinopril   Social History   Tobacco Use   Smoking status: Never Smoker   Smokeless tobacco: Never Used  Substance Use Topics   Alcohol use: No   Drug use: No     Family Hx: The patient's family history includes Heart disease in her father; Stroke in her father.  ROS:   Please see the history of present illness.     All other systems reviewed and are negative.   Prior CV studies:   The following studies were reviewed today:  none  Labs/Other Tests and Data Reviewed:    EKG:  No ECG reviewed.  Recent  Labs: 12/31/2018: ALT 41; BUN 15; Creatinine, Ser 0.79; Hemoglobin 13.3; Platelets 261; Potassium 4.0; Sodium 143; TSH 4.100   Recent Lipid Panel Lab Results  Component Value Date/Time   CHOL 227 (H) 12/31/2018 09:43 AM   TRIG 97 12/31/2018 09:43 AM   HDL 65 12/31/2018 09:43 AM   CHOLHDL 3.5 12/31/2018 09:43 AM   LDLCALC 143 (H) 12/31/2018 09:43 AM    Wt Readings from Last 3 Encounters:  09/17/19 122 lb (55.3 kg)  07/31/19 125 lb 9.6 oz (57 kg)  06/24/19 124 lb 12.8 oz (56.6 kg)     Objective:    Vital Signs:  BP 140/74    Pulse 72    Temp 97.7 F (36.5 C)    Ht 5\' 2"  (1.575 m)    Wt 122  lb (55.3 kg)    BMI 22.31 kg/m    VITAL SIGNS:  reviewed GEN:  no acute distress RESPIRATORY:  respirations unlabored NEURO:  alert and oriented x 3, no obvious focal deficit PSYCH:  normal affect  ASSESSMENT & PLAN:    Hypertension - valsartan 160 mg - pressures have been well-controlled - no additional agents - will collect CMP    Hyperlipidemia 12/31/2018: Cholesterol, Total 227; HDL 65; LDL Calculated 143; Triglycerides 97 - will start 5 mg crestor x 1 month, then increase to 10 mg crestor - recheck labs in 3 months    COVID-19 Education: The signs and symptoms of COVID-19 were discussed with the patient and how to seek care for testing (follow up with PCP or arrange E-visit).  The importance of social distancing was discussed today.  Time:   Today, I have spent 22 minutes with the patient with telehealth technology discussing the above problems.     Medication Adjustments/Labs and Tests Ordered: Current medicines are reviewed at length with the patient today.  Concerns regarding medicines are outlined above.   Tests Ordered: Orders Placed This Encounter  Procedures   Comprehensive Metabolic Panel (CMET)   Comprehensive Metabolic Panel (CMET)   Lipid Profile    Medication Changes: Meds ordered this encounter  Medications   rosuvastatin (CRESTOR) 10 MG tablet    Sig: Take 1/2 tab (5 mg) by mouth daily for 1 month. Then increase to 1 tab (10 mg) daily.    Dispense:  90 tablet    Refill:  1    Follow Up:  Either In Person or Virtual in 6 month(s)  Signed, 01/02/2019, Marcelino Duster  09/17/2019 3:39 PM    [facility]

## 2019-09-17 ENCOUNTER — Encounter: Payer: Self-pay | Admitting: Physician Assistant

## 2019-09-17 ENCOUNTER — Telehealth (INDEPENDENT_AMBULATORY_CARE_PROVIDER_SITE_OTHER): Payer: Medicare Other | Admitting: Physician Assistant

## 2019-09-17 VITALS — BP 140/74 | HR 72 | Temp 97.7°F | Ht 62.0 in | Wt 122.0 lb

## 2019-09-17 DIAGNOSIS — I10 Essential (primary) hypertension: Secondary | ICD-10-CM

## 2019-09-17 DIAGNOSIS — E782 Mixed hyperlipidemia: Secondary | ICD-10-CM | POA: Diagnosis not present

## 2019-09-17 MED ORDER — ROSUVASTATIN CALCIUM 10 MG PO TABS: ORAL_TABLET | ORAL | 1 refills | Status: DC

## 2019-09-17 NOTE — Patient Instructions (Signed)
Medication Instructions:   START Rosuvastatin (Crestor) 10 mg---take 5 mg (1/2 tab) daily for one month; then increase to 10 mg (1 tab)  *If you need a refill on your cardiac medications before your next appointment, please call your pharmacy*  Lab Work: Your physician recommends that you have lab work done at PCP office: CMET  Your physician recommends that you return for a FASTING lipid profile and CMET in 3 months (January 2021).   If you have labs (blood work) drawn today and your tests are completely normal, you will receive your results only by: Marland Kitchen MyChart Message (if you have MyChart) OR . A paper copy in the mail If you have any lab test that is abnormal or we need to change your treatment, we will call you to review the results.  Follow-Up: At [facility], you and your health needs are our priority.  As part of our continuing mission to provide you with exceptional heart care, we have created designated Provider Care Teams.  These Care Teams include your primary Cardiologist (physician) and Advanced Practice Providers (APPs -  Physician Assistants and Nurse Practitioners) who all work together to provide you with the care you need, when you need it.  Your next appointment:   6 months  The format for your next appointment:   Either In Person or Virtual  Provider:   Fabian Sharp, PA  Other Instructions  Please call our office 2 months in advance to schedule your follow-up appointment with Fabian Sharp, [place].

## 2019-09-19 ENCOUNTER — Telehealth: Payer: Self-pay | Admitting: Adult Health

## 2019-09-19 ENCOUNTER — Other Ambulatory Visit: Payer: Medicare Other

## 2019-09-19 ENCOUNTER — Other Ambulatory Visit: Payer: Self-pay

## 2019-09-19 DIAGNOSIS — I10 Essential (primary) hypertension: Secondary | ICD-10-CM

## 2019-09-19 DIAGNOSIS — E782 Mixed hyperlipidemia: Secondary | ICD-10-CM

## 2019-09-19 NOTE — Telephone Encounter (Signed)
Patient came in for Lab Friday 11/ 13 & ask that she be called with results and explained them to her thoroughly so she can understanding (she BP meds are helping but read up on the side effects and knows it can be effecting her liver & possibly  potassium levels ).  --Forwarding request to medical asst.  --glh

## 2019-09-20 ENCOUNTER — Telehealth: Payer: Self-pay | Admitting: Physician Assistant

## 2019-09-20 LAB — COMPREHENSIVE METABOLIC PANEL WITH GFR
ALT: 11 [IU]/L (ref 0–32)
AST: 17 [IU]/L (ref 0–40)
Albumin/Globulin Ratio: 2.3 — ABNORMAL HIGH (ref 1.2–2.2)
Albumin: 4.6 g/dL (ref 3.7–4.7)
Alkaline Phosphatase: 94 [IU]/L (ref 39–117)
BUN/Creatinine Ratio: 18 (ref 12–28)
BUN: 15 mg/dL (ref 8–27)
Bilirubin Total: 0.6 mg/dL (ref 0.0–1.2)
CO2: 24 mmol/L (ref 20–29)
Calcium: 9.4 mg/dL (ref 8.7–10.3)
Chloride: 106 mmol/L (ref 96–106)
Creatinine, Ser: 0.83 mg/dL (ref 0.57–1.00)
GFR calc Af Amer: 78 mL/min/{1.73_m2}
GFR calc non Af Amer: 67 mL/min/{1.73_m2}
Globulin, Total: 2 g/dL (ref 1.5–4.5)
Glucose: 92 mg/dL (ref 65–99)
Potassium: 4.4 mmol/L (ref 3.5–5.2)
Sodium: 143 mmol/L (ref 134–144)
Total Protein: 6.6 g/dL (ref 6.0–8.5)

## 2019-09-20 LAB — LIPID PANEL
Chol/HDL Ratio: 3.9 ratio (ref 0.0–4.4)
Cholesterol, Total: 226 mg/dL — ABNORMAL HIGH (ref 100–199)
HDL: 58 mg/dL
LDL Chol Calc (NIH): 143 mg/dL — ABNORMAL HIGH (ref 0–99)
Triglycerides: 142 mg/dL (ref 0–149)
VLDL Cholesterol Cal: 25 mg/dL (ref 5–40)

## 2019-09-20 NOTE — Telephone Encounter (Signed)
Patient called b/c she thought she is having a reaction to her blood pressure med.  She was placed on Valsartan in the HTN Clinic in 07/2019.  Her BP has improved since then and she seemed to be tolerating it recently, according to office notes. Today, she notes urinary frequency.  She has not had dysuria or fevers. PLAN: 1. I advised her to remain on Valsartan, as this is not likely the cause of her symptoms. 2. I advised her to call her PCP or go to urgent care to be evaluated for cystitis.   Richardson Dopp, PA-C    09/20/2019 9:07 AM

## 2019-09-22 ENCOUNTER — Telehealth: Payer: Self-pay

## 2019-09-22 ENCOUNTER — Other Ambulatory Visit: Payer: Self-pay

## 2019-09-22 DIAGNOSIS — E079 Disorder of thyroid, unspecified: Secondary | ICD-10-CM

## 2019-09-22 MED ORDER — LEVOTHYROXINE SODIUM 25 MCG PO TABS: 25.0000 ug | ORAL_TABLET | Freq: Every day | ORAL | 0 refills | Status: DC

## 2019-09-22 NOTE — Telephone Encounter (Signed)
levothyroxine (SYNTHROID) 25 MCG tablet 30 tablet 0 09/22/2019    Sig - Route: Take 1 tablet (25 mcg total) by mouth daily before breakfast. - Oral   Sent to pharmacy as: levothyroxine (SYNTHROID) 25 MCG tablet   E-Prescribing Status: Receipt confirmed by pharmacy (09/22/2019 11:58 AM EST)

## 2019-09-22 NOTE — Telephone Encounter (Signed)
[facility] message came in after hours. Per Dr. Cordelia Pen signed orders, pt was only provided with 5 tablets, then advised by Dr. Loanne Drilling to call and schedule appt. This Rx cannot be refilled without an appt. Please call pt back to schedule appt. Once scheduled, route this message back. Refill x 1 will be provided once she has scheduled.

## 2019-09-22 NOTE — Telephone Encounter (Signed)
LMOM; patient is to call back if additional help needed.

## 2019-09-22 NOTE — Telephone Encounter (Signed)
Pt called [facility] after hours requesting refill of Levothyroxine. [facility] paged Dr. Loanne Drilling and received order to refill Levothyroxine x5 tablets and to advise pt to call the office on 09/22/19 to schedule appt. No further refills can be provided without an appt. Dr. Loanne Drilling signed [facility] order.  Company: [facility]  Document: orders Other records requested: NONE  All above requested information has been faxed successfully to [employer] listed above. Documents and fax confirmation have been placed in the faxed file for future reference.

## 2019-09-22 NOTE — Telephone Encounter (Signed)
Patient scheduled 10/01/2019 10:15am

## 2019-09-22 NOTE — Telephone Encounter (Signed)
MEDICATION: levothyroxine (SYNTHROID) 25 MCG tablet  PHARMACY:  [facility] - [place], [place] A 90 DAY SUPPLY : no  IS PATIENT OUT OF MEDICATION: no  IF NOT; HOW MUCH IS LEFT: 5  LAST APPOINTMENT DATE: @Visit  date not found  NEXT APPOINTMENT DATE:@Visit  date not found  DO WE HAVE YOUR PERMISSION TO LEAVE A DETAILED MESSAGE:  OTHER COMMENTS:    **Let patient know to contact pharmacy at the end of the day to make sure medication is ready. **  ** Please notify patient to allow 48-72 hours to process**  **Encourage patient to contact the pharmacy for refills or they can request refills through [facility]**

## 2019-09-22 NOTE — Telephone Encounter (Signed)
Noted.  Will contact pt once Mina Marble has reviewed labs and advised of results/ recommendations.  Charyl Bigger, CMA

## 2019-09-24 ENCOUNTER — Telehealth: Payer: Self-pay

## 2019-09-24 ENCOUNTER — Other Ambulatory Visit: Payer: Self-pay

## 2019-09-24 DIAGNOSIS — E079 Disorder of thyroid, unspecified: Secondary | ICD-10-CM

## 2019-09-24 MED ORDER — LEVOTHYROXINE SODIUM 25 MCG PO TABS: 25.0000 ug | ORAL_TABLET | Freq: Every day | ORAL | 0 refills | Status: DC

## 2019-09-24 NOTE — Telephone Encounter (Signed)
levothyroxine (SYNTHROID) 25 MCG tablet 90 tablet 0 09/24/2019    Sig - Route: Take 1 tablet (25 mcg total) by mouth daily before breakfast. - Oral   Sent to pharmacy as: levothyroxine (SYNTHROID) 25 MCG tablet   E-Prescribing Status: Receipt confirmed by pharmacy (09/24/2019 9:41 AM EST)

## 2019-09-24 NOTE — Telephone Encounter (Signed)
please refill x 90 days

## 2019-09-24 NOTE — Telephone Encounter (Signed)
Please advise if you want a 90 day supply OR if you want enough tabs until pt is seen next week AND labs completed?

## 2019-09-24 NOTE — Telephone Encounter (Signed)
MEDICATION:   levothyroxine (SYNTHROID) 25 MCG tablet     PHARMACY:  [facility] - [place], [place] - [place] A 90 DAY SUPPLY : yes  IS PATIENT OUT OF MEDICATION: no  IF NOT; HOW MUCH IS LEFT: 3 pills left   LAST APPOINTMENT DATE: @11 /16/2020  NEXT APPOINTMENT DATE:@11 /25/2020  DO WE HAVE YOUR PERMISSION TO LEAVE A DETAILED MESSAGE:  OTHER COMMENTS:    **Let patient know to contact pharmacy at the end of the day to make sure medication is ready. **  ** Please notify patient to allow 48-72 hours to process**  **Encourage patient to contact the pharmacy for refills or they can request refills through [facility]**

## 2019-09-26 ENCOUNTER — Ambulatory Visit: Payer: Medicare Other | Admitting: Physician Assistant

## 2019-09-29 ENCOUNTER — Ambulatory Visit: Payer: Medicare Other | Admitting: Physician Assistant

## 2019-09-29 ENCOUNTER — Other Ambulatory Visit: Payer: Self-pay | Admitting: Cardiovascular Disease

## 2019-10-01 ENCOUNTER — Ambulatory Visit: Payer: Medicare Other | Admitting: Endocrinology

## 2019-10-06 ENCOUNTER — Other Ambulatory Visit: Payer: Self-pay

## 2019-10-06 ENCOUNTER — Ambulatory Visit (INDEPENDENT_AMBULATORY_CARE_PROVIDER_SITE_OTHER): Payer: Medicare Other | Admitting: Endocrinology

## 2019-10-06 DIAGNOSIS — E041 Nontoxic single thyroid nodule: Secondary | ICD-10-CM | POA: Diagnosis not present

## 2019-10-06 NOTE — Progress Notes (Signed)
Subjective:    Patient ID: Alejandra Alexander, female    DOB: 1940/11/23, 78 y.o.   MRN: [id number]  HPI  telehealth visit today via phone x 5 minutes Alternatives to telehealth are presented to this patient, and the patient agrees to the telehealth visit. Pt is advised of the cost of the visit, and agrees to this, also.   Patient is at home, and I am at the office.   Persons attending the telehealth visit: the patient and I pt returns for f/u of multinodular goiter (first noted in 2006; she has been followed with serial [place] since then; in 2014, she had bx: result was non-neoplasic goiter (bx in 2006 was also benign); also in 2014, she was noted to have an elevated TSH, and started taking synthroid).  pt states she feels well in general.  She does not notice the goiter.   Past Medical History:  Diagnosis Date  . Allergy    seasonal  . Anemia   . Chickenpox   . Hypertension   . Thyroid disease     No past surgical history on file.  Social History   Socioeconomic History  . Marital status: Widowed    Spouse name: Not on file  . Number of children: Not on file  . Years of education: Not on file  . Highest education level: Not on file  Occupational History  . Not on file  Social Needs  . Financial resource strain: Not on file  . Food insecurity    Worry: Not on file    Inability: Not on file  . Transportation needs    Medical: Not on file    Non-medical: Not on file  Tobacco Use  . Smoking status: Never Smoker  . Smokeless tobacco: Never Used  Substance and Sexual Activity  . Alcohol use: No  . Drug use: No  . Sexual activity: Not Currently  Lifestyle  . Physical activity    Days per week: Not on file    Minutes per session: Not on file  . Stress: Not on file  Relationships  . Social Herbalist on phone: Not on file    Gets together: Not on file    Attends religious service: Not on file    Active member of club or organization: Not on file    Attends  meetings of clubs or organizations: Not on file    Relationship status: Not on file  . Intimate partner violence    Fear of current or ex partner: Not on file    Emotionally abused: Not on file    Physically abused: Not on file    Forced sexual activity: Not on file  Other Topics Concern  . Not on file  Social History Narrative  . Not on file    Current Outpatient Medications on File Prior to Visit  Medication Sig Dispense Refill  . levothyroxine (SYNTHROID) 25 MCG tablet Take 1 tablet (25 mcg total) by mouth daily before breakfast. 90 tablet 0  . montelukast (SINGULAIR) 10 MG tablet Take 10 mg by mouth at bedtime.    . rosuvastatin (CRESTOR) 10 MG tablet Take 1/2 tab (5 mg) by mouth daily for 1 month. Then increase to 1 tab (10 mg) daily. 90 tablet 1  . valsartan (DIOVAN) 160 MG tablet TAKE 1 TABLET BY MOUTH DAILY. TO REPLACE LOSARTAN 30 tablet 1   No current facility-administered medications on file prior to visit.     Allergies  Allergen Reactions  . Lisinopril Other (See Comments)    Weakness, visual disturbances    Family History  Problem Relation Age of Onset  . Heart disease Father   . Stroke Father     There were no vitals taken for this visit.   Review of Systems She denies neck pain    Objective:   Physical Exam    Lab Results  Component Value Date   TSH 4.100 12/31/2018      Assessment & Plan:  MNG: apparently clinically stable.  we discussed.  She declines f/u US for now.   Patient Instructions  Please continue the same levothyroxine.   Please come back for a follow-up appointment when the coronavirus gets better.

## 2019-10-06 NOTE — Patient Instructions (Addendum)
Please continue the same levothyroxine.   Please come back for a follow-up appointment when the coronavirus gets better.

## 2019-11-04 ENCOUNTER — Ambulatory Visit: Payer: Medicare Other | Admitting: Adult Health

## 2019-11-04 ENCOUNTER — Encounter: Payer: Self-pay | Admitting: Adult Health

## 2019-11-04 VITALS — BP 138/80 | HR 81 | Temp 97.9°F | Ht 62.0 in | Wt 122.0 lb

## 2019-11-04 DIAGNOSIS — Z0000 Encounter for general adult medical examination without abnormal findings: Secondary | ICD-10-CM

## 2019-11-04 DIAGNOSIS — Z1211 Encounter for screening for malignant neoplasm of colon: Secondary | ICD-10-CM

## 2019-11-04 NOTE — Progress Notes (Signed)
Subjective:   JANANN BOEVE is a 78 y.o. female who presents for Medicare Annual (Subsequent) preventive examination.  Review of Systems: General:   Denies fever, chills, unexplained weight loss.  Optho/Auditory:   Denies visual changes, blurred vision/LOV Respiratory:   Denies SOB, DOE more than baseline levels.  Cardiovascular:   Denies chest pain, palpitations, new onset peripheral edema  Gastrointestinal:   Denies nausea, vomiting, diarrhea.  Genitourinary: Denies dysuria, freq/ urgency, flank pain or discharge from genitals.  Endocrine:     Denies hot or cold intolerance, polyuria, polydipsia. Musculoskeletal:   Denies unexplained myalgias, joint swelling, unexplained arthralgias, gait problems.  Skin:  Denies rash, suspicious lesions Neurological:     Denies dizziness, unexplained weakness, numbness  Psychiatric/Behavioral:   Denies mood changes, suicidal or homicidal ideations, hallucinations       Objective:     Vitals: BP 138/80   Pulse 81   Temp 97.9 F (36.6 C) (Oral)   Ht 5\' 2"  (1.575 m)   Wt 122 lb (55.3 kg)   BMI 22.31 kg/m   Body mass index is 22.31 kg/m.  No flowsheet data found.  Tobacco Social History   Tobacco Use  Smoking Status Never Smoker  Smokeless Tobacco Never Used     Counseling given: Not Answered    Past Medical History:  Diagnosis Date  . Allergy    seasonal  . Anemia   . Chickenpox   . Hypertension   . Thyroid disease    History reviewed. No pertinent surgical history. Family History  Problem Relation Age of Onset  . Heart disease Father   . Stroke Father    Social History   Socioeconomic History  . Marital status: Widowed    Spouse name: Not on file  . Number of children: Not on file  . Years of education: Not on file  . Highest education level: Not on file  Occupational History  . Not on file  Tobacco Use  . Smoking status: Never Smoker  . Smokeless tobacco: Never Used  Substance and Sexual Activity  .  Alcohol use: No  . Drug use: No  . Sexual activity: Not Currently  Other Topics Concern  . Not on file  Social History Narrative  . Not on file   Social Determinants of Health   Financial Resource Strain:   . Difficulty of Paying Living Expenses: Not on file  Food Insecurity:   . Worried About Charity fundraiser in the Last Year: Not on file  . Ran Out of Food in the Last Year: Not on file  Transportation Needs:   . Lack of Transportation (Medical): Not on file  . Lack of Transportation (Non-Medical): Not on file  Physical Activity:   . Days of Exercise per Week: Not on file  . Minutes of Exercise per Session: Not on file  Stress:   . Feeling of Stress : Not on file  Social Connections:   . Frequency of Communication with Friends and Family: Not on file  . Frequency of Social Gatherings with Friends and Family: Not on file  . Attends Religious Services: Not on file  . Active Member of Clubs or Organizations: Not on file  . Attends Archivist Meetings: Not on file  . Marital Status: Not on file    Outpatient Encounter Medications as of 11/04/2019  Medication Sig  . levothyroxine (SYNTHROID) 25 MCG tablet Take 1 tablet (25 mcg total) by mouth daily before breakfast.  .  valsartan (DIOVAN) 160 MG tablet TAKE 1 TABLET BY MOUTH DAILY. TO REPLACE LOSARTAN  . [DISCONTINUED] montelukast (SINGULAIR) 10 MG tablet Take 10 mg by mouth at bedtime.  . [DISCONTINUED] rosuvastatin (CRESTOR) 10 MG tablet Take 1/2 tab (5 mg) by mouth daily for 1 month. Then increase to 1 tab (10 mg) daily.   No facility-administered encounter medications on file as of 11/04/2019.    Activities of Daily Living In your present state of health, do you have any difficulty performing the following activities: 11/04/2019  Hearing? N  Vision? N  Difficulty concentrating or making decisions? N  Walking or climbing stairs? N  Dressing or bathing? N  Doing errands, shopping? N  Some recent data might  be hidden    Patient Care Team: Julaine Fusianford, Katy D, NP as PCP - General (Family Medicine) Runell GessBerry, Jonathan J, MD as PCP - Cardiology (Cardiology)    Assessment:   This is a routine wellness examination for Nicole CellaDorothy.  Exercise Activities and Dietary recommendations Walking 2630mins/daily Heart healthy diet- well balanced Estimates to drink >40 oz/day  Fall Risk Fall Risk  11/04/2019 10/23/2018 05/16/2018  Falls in the past year? 0 0 No  Follow up Falls evaluation completed - -   Is the patient's home free of loose throw rugs in walkways, pet beds, electrical cords, etc?   no      Grab bars in the bathroom? no      Handrails on the stairs?   yes      Adequate lighting?   yes    Depression Screen PHQ 2/9 Scores 11/04/2019 12/30/2018 10/23/2018 07/29/2018  PHQ - 2 Score 0 0 0 0  PHQ- 9 Score 0 0 0 0  Exception Documentation - - - -     Cognitive Function- WNL     6CIT Screen 11/04/2019  What Year? 0 points  What month? 0 points  What time? 0 points  Count back from 20 0 points  Months in reverse 2 points  Repeat phrase 4 points  Total Score 6    Immunization History  Administered Date(s) Administered  . Fluad Quad(high Dose 65+) 09/12/2019  . Influenza Whole 09/16/2007, 09/03/2009  . Influenza, High Dose Seasonal PF 10/23/2018  . Influenza,inj,Quad PF,6+ Mos 09/09/2015  . Pneumococcal Conjugate-13 09/09/2015  . Pneumococcal Polysaccharide-23 09/06/2005    Qualifies for Shingles Vaccine?Yes- she needs  vaccination in series   Screening Tests Health Maintenance  Topic Date Due  . TETANUS/TDAP  08/31/1959  . DEXA SCAN  08/30/2005  . INFLUENZA VACCINE  Completed  . PNA vac Low Risk Adult  Completed    Cancer Screenings: Lung: Low Dose CT Chest recommended if Age 2-80 years, 30 pack-year currently smoking OR have quit w/in 15years. Patient does not qualify. Breast:  Up to date on Mammogram? No   Up to date of Bone Density/Dexa? No Colorectal: Cologuard Zostavax  12/17/2013- needs Shingrix series  Additional Screenings:  Hepatitis C Screening:  Pt declined     Plan:   Continue all medications as directed Continue regular f/u Cardiologist/Dr. Particia NearingBerry    ontinue regular f/u with Endocrinology  Pt declined Colonoscopy- ordered Cologuard Continue with heart healthy diet, continue daily walking. Fasting labs Feb 2021- she would like to hold off on CPE until Spring 2021 due to COVID-19 exposure concerns.  I have personally reviewed and noted the following in the patient's chart:   . Medical and social history . Use of alcohol, tobacco or illicit drugs  . Current medications  and supplements . Functional ability and status . Nutritional status . Physical activity . Advanced directives . List of other physicians . Hospitalizations, surgeries, and ER visits in previous 12 months . Vitals . Screenings to include cognitive, depression, and falls . Referrals and appointments  In addition, I have reviewed and discussed with patient certain preventive protocols, quality metrics, and best practice recommendations. A written personalized care plan for preventive services as well as general preventive health recommendations were provided to patient.     Marchelle Folks, CMA  11/04/2019

## 2019-12-01 ENCOUNTER — Other Ambulatory Visit: Payer: Self-pay | Admitting: Cardiovascular Disease

## 2019-12-29 ENCOUNTER — Other Ambulatory Visit: Payer: Self-pay | Admitting: Family Medicine

## 2019-12-29 DIAGNOSIS — E079 Disorder of thyroid, unspecified: Secondary | ICD-10-CM

## 2019-12-29 DIAGNOSIS — I10 Essential (primary) hypertension: Secondary | ICD-10-CM

## 2019-12-29 DIAGNOSIS — Z0000 Encounter for general adult medical examination without abnormal findings: Secondary | ICD-10-CM

## 2019-12-29 DIAGNOSIS — E782 Mixed hyperlipidemia: Secondary | ICD-10-CM

## 2019-12-29 DIAGNOSIS — E041 Nontoxic single thyroid nodule: Secondary | ICD-10-CM

## 2019-12-29 NOTE — Progress Notes (Signed)
Continue all medications as directed Continue regular f/u Cardiologist/Dr. Particia Nearing regular f/u with Endocrinology  Pt declined Colonoscopy- ordered Cologuard Continue with heart healthy diet, continue daily walking. Fasting labs Feb 2021- she would like to hold off on CPE until Spring 2021 due to COVID-19 exposure concerns.  Per Jettie Pagan last note from 11/04/19 -fas labs in feb 2021.   Labs ordered per the above. AS, CMA

## 2020-01-02 ENCOUNTER — Other Ambulatory Visit: Payer: Self-pay

## 2020-01-02 ENCOUNTER — Other Ambulatory Visit: Payer: Medicare Other

## 2020-01-02 DIAGNOSIS — E041 Nontoxic single thyroid nodule: Secondary | ICD-10-CM

## 2020-01-02 DIAGNOSIS — E079 Disorder of thyroid, unspecified: Secondary | ICD-10-CM

## 2020-01-02 DIAGNOSIS — Z0000 Encounter for general adult medical examination without abnormal findings: Secondary | ICD-10-CM

## 2020-01-02 DIAGNOSIS — I10 Essential (primary) hypertension: Secondary | ICD-10-CM

## 2020-01-02 DIAGNOSIS — E782 Mixed hyperlipidemia: Secondary | ICD-10-CM

## 2020-01-03 LAB — COMPREHENSIVE METABOLIC PANEL
ALT: 15 IU/L (ref 0–32)
AST: 21 IU/L (ref 0–40)
Albumin/Globulin Ratio: 2.2 (ref 1.2–2.2)
Albumin: 4.7 g/dL (ref 3.7–4.7)
Alkaline Phosphatase: 93 IU/L (ref 39–117)
BUN/Creatinine Ratio: 18 (ref 12–28)
BUN: 16 mg/dL (ref 8–27)
Bilirubin Total: 0.6 mg/dL (ref 0.0–1.2)
CO2: 22 mmol/L (ref 20–29)
Calcium: 9.5 mg/dL (ref 8.7–10.3)
Chloride: 104 mmol/L (ref 96–106)
Creatinine, Ser: 0.88 mg/dL (ref 0.57–1.00)
GFR calc Af Amer: 72 mL/min/{1.73_m2} (ref >59)
GFR calc non Af Amer: 63 mL/min/{1.73_m2} (ref >59)
Globulin, Total: 2.1 g/dL (ref 1.5–4.5)
Glucose: 80 mg/dL (ref 65–99)
Potassium: 4.1 mmol/L (ref 3.5–5.2)
Sodium: 141 mmol/L (ref 134–144)
Total Protein: 6.8 g/dL (ref 6.0–8.5)

## 2020-01-03 LAB — CBC
Hematocrit: 42.1 % (ref 34.0–46.6)
Hemoglobin: 14.5 g/dL (ref 11.1–15.9)
MCH: 29.6 pg (ref 26.6–33.0)
MCHC: 34.4 g/dL (ref 31.5–35.7)
MCV: 86 fL (ref 79–97)
Platelets: 267 10*3/uL (ref 150–450)
RBC: 4.9 x10E6/uL (ref 3.77–5.28)
RDW: 12.9 % (ref 11.7–15.4)
WBC: 5.6 10*3/uL (ref 3.4–10.8)

## 2020-01-03 LAB — HEMOGLOBIN A1C
Est. average glucose Bld gHb Est-mCnc: 111 mg/dL
Hgb A1c MFr Bld: 5.5 % (ref 4.8–5.6)

## 2020-01-03 LAB — LIPID PANEL
Chol/HDL Ratio: 4.2 ratio (ref 0.0–4.4)
Cholesterol, Total: 250 mg/dL — ABNORMAL HIGH (ref 100–199)
HDL: 60 mg/dL (ref >39)
LDL Chol Calc (NIH): 161 mg/dL — ABNORMAL HIGH (ref 0–99)
Triglycerides: 162 mg/dL — ABNORMAL HIGH (ref 0–149)
VLDL Cholesterol Cal: 29 mg/dL (ref 5–40)

## 2020-01-03 LAB — VITAMIN D 25 HYDROXY (VIT D DEFICIENCY, FRACTURES): Vit D, 25-Hydroxy: 15.1 ng/mL — ABNORMAL LOW (ref 30.0–100.0)

## 2020-01-03 LAB — TSH: TSH: 5.05 u[IU]/mL — ABNORMAL HIGH (ref 0.450–4.500)

## 2020-01-03 LAB — T4, FREE: Free T4: 0.98 ng/dL (ref 0.82–1.77)

## 2020-01-04 ENCOUNTER — Ambulatory Visit: Payer: Medicare Other | Attending: Internal Medicine

## 2020-01-04 DIAGNOSIS — Z23 Encounter for immunization: Secondary | ICD-10-CM

## 2020-01-04 NOTE — Progress Notes (Signed)
   Covid-19 Vaccination Clinic  Name:  LATAYNA RITCHIE    MRN: [id number] DOB: June 22, 1941  01/04/2020  Ms. Hendricksen was observed post Covid-19 immunization for 15 minutes without incidence. She was provided with Vaccine Information Sheet and instruction to access the V-Safe system.   Ms. Satchell was instructed to call 911 with any severe reactions post vaccine: Marland Kitchen Difficulty breathing  . Swelling of your face and throat  . A fast heartbeat  . A bad rash all over your body  . Dizziness and weakness    Immunizations Administered    Name Date Dose VIS Date Route   Pfizer COVID-19 Vaccine 01/04/2020 10:42 AM 0.3 mL 10/17/2019 Intramuscular   Manufacturer: [employer], [employer]   Lot: [id number]   NDC: [address]-0

## 2020-01-06 ENCOUNTER — Other Ambulatory Visit: Payer: Self-pay | Admitting: Family Medicine

## 2020-01-06 DIAGNOSIS — E559 Vitamin D deficiency, unspecified: Secondary | ICD-10-CM

## 2020-01-06 NOTE — Progress Notes (Signed)
Lastly, her vitamin D is too low and I recommend she take D3 over-the-counter 5000 IUs daily. This should be rechecked in 4 months after she is consistently taking it daily.   It has been sometime since she has followed up for chronic conditions with our practice, recently seen by Florentina Addison for Medicare wellness only hence, please have her follow-up in 4 months for vitamin D recheck lab and an office visit following.   Thank you very much Deno Sida for discussing these issues with the patient to ensure it does not fall through the cracks.   Dr. Sharee Holster    Future lab ordered per Dr. Sharee Holster. AS, CMA

## 2020-01-07 ENCOUNTER — Telehealth: Payer: Self-pay

## 2020-01-07 NOTE — Telephone Encounter (Signed)
Lab results sent from PCP to Dr. Everardo All for hism to review and address. Labs have been reviewed by Dr. Everardo All and has requested pt schedule an appt to further discuss. LVM requesting returned call.

## 2020-01-07 NOTE — Telephone Encounter (Signed)
-----   Message from Romero Belling, MD sent at 01/07/2020 10:36 AM EST ----- please contact patient: F/u is due ----- Message ----- From: Sylvester Harder, CMA Sent: 01/06/2020  11:05 AM EST To: Romero Belling, MD, Runell Gess, MD  Patient is aware of the results and verbalized understanding. Patient is aware to start Vit D3 OTC 5000IU Daily.  Future lab placed for Vit D ( 4 months) and call transferred to front desk to schedule lab apt and follow up apt.   Labs forwarded to Cardiologist and Endocrinologist to review. Patient is aware to contact them regarding cholesterol and thyroid per Dr. Sharee Holster. AS, CMA

## 2020-01-09 NOTE — Telephone Encounter (Signed)
LMTCB

## 2020-01-14 ENCOUNTER — Encounter: Payer: Self-pay | Admitting: Endocrinology

## 2020-01-14 NOTE — Telephone Encounter (Signed)
LMTCB Mailing letter Last attempt

## 2020-01-19 ENCOUNTER — Other Ambulatory Visit: Payer: Self-pay | Admitting: Endocrinology

## 2020-01-19 DIAGNOSIS — E079 Disorder of thyroid, unspecified: Secondary | ICD-10-CM

## 2020-01-28 ENCOUNTER — Ambulatory Visit: Payer: Medicare Other | Attending: Internal Medicine

## 2020-01-28 DIAGNOSIS — Z23 Encounter for immunization: Secondary | ICD-10-CM

## 2020-01-28 NOTE — Progress Notes (Signed)
   Covid-19 Vaccination Clinic  Name:  COREN CROWNOVER    MRN: [id number] DOB: 29-Nov-1940  01/28/2020  Ms. Ressler was observed post Covid-19 immunization for 15 minutes without incident. She was provided with Vaccine Information Sheet and instruction to access the V-Safe system.   Ms. Gosdin was instructed to call 911 with any severe reactions post vaccine: Marland Kitchen Difficulty breathing  . Swelling of face and throat  . A fast heartbeat  . A bad rash all over body  . Dizziness and weakness   Immunizations Administered    Name Date Dose VIS Date Route   Pfizer COVID-19 Vaccine 01/28/2020  2:18 PM 0.3 mL 10/17/2019 Intramuscular   Manufacturer: [employer], [employer]   Lot: [id number]   NDC: [address]-8

## 2020-02-24 ENCOUNTER — Other Ambulatory Visit: Payer: Self-pay

## 2020-02-24 ENCOUNTER — Encounter: Payer: Self-pay | Admitting: Endocrinology

## 2020-02-24 ENCOUNTER — Ambulatory Visit (INDEPENDENT_AMBULATORY_CARE_PROVIDER_SITE_OTHER): Payer: Medicare Other | Admitting: Endocrinology

## 2020-02-24 DIAGNOSIS — E042 Nontoxic multinodular goiter: Secondary | ICD-10-CM

## 2020-02-24 MED ORDER — LEVOTHYROXINE SODIUM 50 MCG PO TABS: 50.0000 ug | ORAL_TABLET | Freq: Every day | ORAL | 3 refills | Status: DC

## 2020-02-24 NOTE — Progress Notes (Signed)
Subjective:    Patient ID: Alejandra Alexander, female    DOB: [birth year], 79 y.o.   MRN: [id number]  HPI pt returns for f/u of multinodular goiter (first noted in 2006; she has been followed with serial [place] since then; in 2014, she had bx: result was non-neoplasic goiter (bx in 2006 was also benign); also in 2014, she was noted to have an elevated TSH, and started taking synthroid).  pt states she feels well in general.  She does not notice the goiter.  She takes synthroid as rx'ed.   Past Medical History:  Diagnosis Date  . Allergy    seasonal  . Anemia   . Chickenpox   . Hypertension   . Thyroid disease     No past surgical history on file.  Social History   Socioeconomic History  . Marital status: Widowed    Spouse name: Not on file  . Number of children: Not on file  . Years of education: Not on file  . Highest education level: Not on file  Occupational History  . Not on file  Tobacco Use  . Smoking status: Never Smoker  . Smokeless tobacco: Never Used  Substance and Sexual Activity  . Alcohol use: No  . Drug use: No  . Sexual activity: Not Currently  Other Topics Concern  . Not on file  Social History Narrative  . Not on file   Social Determinants of Health   Financial Resource Strain:   . Difficulty of Paying Living Expenses:   Food Insecurity:   . Worried About Charity fundraiser in the Last Year:   . Arboriculturist in the Last Year:   Transportation Needs:   . Film/video editor (Medical):   Marland Kitchen Lack of Transportation (Non-Medical):   Physical Activity:   . Days of Exercise per Week:   . Minutes of Exercise per Session:   Stress:   . Feeling of Stress :   Social Connections:   . Frequency of Communication with Friends and Family:   . Frequency of Social Gatherings with Friends and Family:   . Attends Religious Services:   . Active Member of Clubs or Organizations:   . Attends Archivist Meetings:   Marland Kitchen Marital Status:   Intimate Partner  Violence:   . Fear of Current or Ex-Partner:   . Emotionally Abused:   Marland Kitchen Physically Abused:   . Sexually Abused:     Current Outpatient Medications on File Prior to Visit  Medication Sig Dispense Refill  . Cholecalciferol (VITAMIN D3) 25 MCG (1000 UT) CAPS Take 2 capsules by mouth 2 (two) times daily.    . rosuvastatin (CRESTOR) 10 MG tablet Take 5 mg by mouth daily.    . valsartan (DIOVAN) 160 MG tablet TAKE 1 TABLET BY MOUTH DAILY. TO REPLACE LOSARTAN 30 tablet 6   No current facility-administered medications on file prior to visit.    Allergies  Allergen Reactions  . Lisinopril Other (See Comments)    Weakness, visual disturbances    Family History  Problem Relation Age of Onset  . Heart disease Father   . Stroke Father     BP (!) 178/92   Pulse 87   Ht 5\' 2"  (1.575 m)   Wt 128 lb (58.1 kg)   SpO2 98%   BMI 23.41 kg/m    Review of Systems     Objective:   Physical Exam VITAL SIGNS:  See vs page GENERAL: no distress  NECK: 2 cm thyroid nodule is palpable just to the left of the isthmus.  No palpable lymphadenopathy at the anterior neck.     Lab Results  Component Value Date   TSH 5.050 (H) 01/02/2020       Assessment & Plan:  Hypothyroidism: worse.  She needs increased rx MNG: due for recheck HTN: is noted today   Patient Instructions  Your blood pressure is high today.  Please see your primary care provider soon, to have it rechecked I have sent a prescription to your pharmacy, to increase the levothyroxine. Let's recheck the ultrasound.  you will receive a phone call, about a day and time for an appointment.   Please come back for a follow-up appointment in 6 months.

## 2020-02-24 NOTE — Patient Instructions (Addendum)
Your blood pressure is high today.  Please see your primary care provider soon, to have it rechecked I have sent a prescription to your pharmacy, to increase the levothyroxine. Let's recheck the ultrasound.  you will receive a phone call, about a day and time for an appointment.   Please come back for a follow-up appointment in 6 months.

## 2020-02-25 ENCOUNTER — Ambulatory Visit
Admission: RE | Admit: 2020-02-25 | Discharge: 2020-02-25 | Disposition: A | Discharge status: Home / Self Care | Payer: Medicare Other | Source: Ambulatory Visit | Attending: Endocrinology | Admitting: Endocrinology

## 2020-02-25 DIAGNOSIS — E042 Nontoxic multinodular goiter: Secondary | ICD-10-CM

## 2020-02-25 DIAGNOSIS — E041 Nontoxic single thyroid nodule: Secondary | ICD-10-CM | POA: Diagnosis not present

## 2020-03-11 DIAGNOSIS — R7989 Other specified abnormal findings of blood chemistry: Secondary | ICD-10-CM | POA: Insufficient documentation

## 2020-03-11 DIAGNOSIS — E785 Hyperlipidemia, unspecified: Secondary | ICD-10-CM | POA: Insufficient documentation

## 2020-03-11 DIAGNOSIS — E559 Vitamin D deficiency, unspecified: Secondary | ICD-10-CM | POA: Insufficient documentation

## 2020-05-05 ENCOUNTER — Other Ambulatory Visit: Payer: Self-pay | Admitting: Physician Assistant

## 2020-05-05 DIAGNOSIS — E559 Vitamin D deficiency, unspecified: Secondary | ICD-10-CM

## 2020-05-06 ENCOUNTER — Other Ambulatory Visit: Payer: Medicare Other

## 2020-05-06 ENCOUNTER — Other Ambulatory Visit: Payer: Self-pay

## 2020-05-06 DIAGNOSIS — E559 Vitamin D deficiency, unspecified: Secondary | ICD-10-CM | POA: Diagnosis not present

## 2020-05-07 LAB — VITAMIN D 25 HYDROXY (VIT D DEFICIENCY, FRACTURES): Vit D, 25-Hydroxy: 20.1 ng/mL — ABNORMAL LOW (ref 30.0–100.0)

## 2020-05-11 ENCOUNTER — Ambulatory Visit (INDEPENDENT_AMBULATORY_CARE_PROVIDER_SITE_OTHER): Payer: Medicare Other | Admitting: Physician Assistant

## 2020-05-11 ENCOUNTER — Other Ambulatory Visit: Payer: Self-pay

## 2020-05-11 ENCOUNTER — Encounter: Payer: Self-pay | Admitting: Physician Assistant

## 2020-05-11 VITALS — BP 154/68 | HR 72 | Temp 97.9°F | Ht 62.0 in | Wt 128.7 lb

## 2020-05-11 DIAGNOSIS — E079 Disorder of thyroid, unspecified: Secondary | ICD-10-CM | POA: Diagnosis not present

## 2020-05-11 DIAGNOSIS — E559 Vitamin D deficiency, unspecified: Secondary | ICD-10-CM

## 2020-05-11 DIAGNOSIS — E782 Mixed hyperlipidemia: Secondary | ICD-10-CM | POA: Diagnosis not present

## 2020-05-11 DIAGNOSIS — I10 Essential (primary) hypertension: Secondary | ICD-10-CM

## 2020-05-11 NOTE — Assessment & Plan Note (Addendum)
-   BP today is 154/68 HR 72, stable - Followed by cardiology. - Continue Valsartan 160 mg. Pt doesn't want to make any medication changes at this time.  - Continue ambulatory BP and pulse monitoring few times/wk and keep a log. Notify the clinic if BP consistently >140/90. - Continue DASH diet. - Stay well hydrated, at least 64 fl oz - Encourage to continue walking.

## 2020-05-11 NOTE — Progress Notes (Signed)
Established Patient Office Visit  Subjective:  Patient ID: Alejandra Alexander, female    DOB: 07/28/1941  Age: 79 y.o. MRN: [id number]  CC:  Chief Complaint  Patient presents with  . Hyperlipidemia  . Hypertension  . Other    Vitamin D deficiency    HPI Alejandra Alexander presents for hyperlipidemia, vitamin D deficiency, and hypertension.  HLD: Followed by cardiology. Pt taking medication as directed without issues. Denies side effects including myalgias and RUQ pain. She follows Mediterranean diet and has started walking again, 3 miles 5 days/wk.  HTN: Followed by cardiology. Pt denies chest pain, palpitations, dizziness or leg swelling. Taking medication as directed without side effects. Checks BP at home once/week and readings range in 135-145/80. Pt follows a low salt diet. Reports her cardiologist told her BP in 150s wasn't bad and was instructed to take her BP medication at night, which she has been doing.  Vitamin D deficiency: Reports taking 2000 units daily. She hasn't been taking 4000 units daily.   Thyroid disease: Followed by Endocrinology- Dr. Everardo All. Reports medication compliance. Denies weight changes, fatigue, palpitations, bowel changes or heat/cold intolerance.  Past Medical History:  Diagnosis Date  . Allergy    seasonal  . Anemia   . Chickenpox   . Hypertension   . Thyroid disease     History reviewed. No pertinent surgical history.  Family History  Problem Relation Age of Onset  . Heart disease Father   . Stroke Father     Social History   Socioeconomic History  . Marital status: Widowed    Spouse name: Not on file  . Number of children: Not on file  . Years of education: Not on file  . Highest education level: Not on file  Occupational History  . Not on file  Tobacco Use  . Smoking status: Never Smoker  . Smokeless tobacco: Never Used  Vaping Use  . Vaping Use: Never used  Substance and Sexual Activity  . Alcohol use: No  . Drug use: No   . Sexual activity: Not Currently  Other Topics Concern  . Not on file  Social History Narrative  . Not on file   Social Determinants of Health   Financial Resource Strain:   . Difficulty of Paying Living Expenses:   Food Insecurity:   . Worried About Programme researcher, broadcasting/film/video in the Last Year:   . Barista in the Last Year:   Transportation Needs:   . Freight forwarder (Medical):   Marland Kitchen Lack of Transportation (Non-Medical):   Physical Activity:   . Days of Exercise per Week:   . Minutes of Exercise per Session:   Stress:   . Feeling of Stress :   Social Connections:   . Frequency of Communication with Friends and Family:   . Frequency of Social Gatherings with Friends and Family:   . Attends Religious Services:   . Active Member of Clubs or Organizations:   . Attends Banker Meetings:   Marland Kitchen Marital Status:   Intimate Partner Violence:   . Fear of Current or Ex-Partner:   . Emotionally Abused:   Marland Kitchen Physically Abused:   . Sexually Abused:     Outpatient Medications Prior to Visit  Medication Sig Dispense Refill  . Cholecalciferol (VITAMIN D3) 25 MCG (1000 UT) CAPS Take 2 capsules by mouth 2 (two) times daily.    Marland Kitchen levothyroxine (SYNTHROID) 50 MCG tablet Take 1 tablet (50 mcg total) by  mouth daily. 90 tablet 3  . rosuvastatin (CRESTOR) 10 MG tablet Take 5 mg by mouth daily.    . valsartan (DIOVAN) 160 MG tablet TAKE 1 TABLET BY MOUTH DAILY. TO REPLACE LOSARTAN 30 tablet 6   No facility-administered medications prior to visit.    Allergies  Allergen Reactions  . Lisinopril Other (See Comments)    Weakness, visual disturbances    ROS Review of Systems  A fourteen system review of systems was performed and found to be positive as per HPI.    Objective:    Physical Exam General: Well nourished, in no apparent distress. Eyes: PERRLA, EOMs, conjunctiva clr Resp: Respiratory effort- normal, ECTA B/L w/o W/R/R  Cardio: RRR w/o MRGs. Abdomen: no  gross distention. Lymphatics:  less 2 sec cap RF M-sk: Full ROM, 5/5 strength, normal gait.  Skin: Warm, dry  Neuro: Alert, Oriented Psych: Normal affect, Insight and Judgment appropriate.   BP (!) 154/68   Pulse 72   Temp 97.9 F (36.6 C) (Oral)   Ht 5\' 2"  (1.575 m)   Wt 128 lb 11.2 oz (58.4 kg)   SpO2 99% Comment: on RA  BMI 23.54 kg/m  Wt Readings from Last 3 Encounters:  05/11/20 128 lb 11.2 oz (58.4 kg)  02/24/20 128 lb (58.1 kg)  11/04/19 122 lb (55.3 kg)     Health Maintenance Due  Topic Date Due  . Hepatitis C Screening  Never done  . TETANUS/TDAP  Never done  . DEXA SCAN  Never done    There are no preventive care reminders to display for this patient.  Lab Results  Component Value Date   TSH 5.050 (H) 01/02/2020   Lab Results  Component Value Date   WBC 5.6 01/02/2020   HGB 14.5 01/02/2020   HCT 42.1 01/02/2020   MCV 86 01/02/2020   PLT 267 01/02/2020   Lab Results  Component Value Date   NA 141 01/02/2020   K 4.1 01/02/2020   CO2 22 01/02/2020   GLUCOSE 80 01/02/2020   BUN 16 01/02/2020   CREATININE 0.88 01/02/2020   BILITOT 0.6 01/02/2020   ALKPHOS 93 01/02/2020   AST 21 01/02/2020   ALT 15 01/02/2020   PROT 6.8 01/02/2020   ALBUMIN 4.7 01/02/2020   CALCIUM 9.5 01/02/2020   Lab Results  Component Value Date   CHOL 250 (H) 01/02/2020   Lab Results  Component Value Date   HDL 60 01/02/2020   Lab Results  Component Value Date   LDLCALC 161 (H) 01/02/2020   Lab Results  Component Value Date   TRIG 162 (H) 01/02/2020   Lab Results  Component Value Date   CHOLHDL 4.2 01/02/2020   Lab Results  Component Value Date   HGBA1C 5.5 01/02/2020      Assessment & Plan:   Problem List Items Addressed This Visit      Cardiovascular and Mediastinum   Hypertension - Primary (Chronic)    - BP today is 154/68 HR 72, stable - Followed by cardiology. - Continue Valsartan 160 mg. - Continue ambulatory BP and pulse monitoring few  times/wk and keep a log. - Continue DASH diet. - Stay well hydrated, at least 64 fl oz  - Encourage to continue walking.          Other   Hyperlipidemia    - Last lipid panel: Total cholesterol, triglycerides, and LDL elevated. - Continue Crestor 10 mg. -Follow heart healthy/Mediterranean diet.  Vitamin D deficiency    -Most recent vitamin D 20.1, low -Patient prefers to increase 1000 units at a time, so will start taking 3000 units once daily. -Will recheck vitamin D in 3 months.        Other Visit Diagnoses    Thyroid disease         Thyroid disease: -Continue to follow-up with Dr. Everardo All (endocrinology). Asymptomatic. -Reviewed most recent OV 02/2020 -Continue levothyroxine 50 mcg.  No orders of the defined types were placed in this encounter.   Follow-up: Return in about 3 months (around 08/11/2020) for HTN, HLD, Vit D def; lab visit few days prior OV (lipid panel, cmp, vit d).    Mayer Masker, PA-C

## 2020-05-11 NOTE — Assessment & Plan Note (Signed)
-  Most recent vitamin D 20.1, low -Patient prefers to increase 1000 units at a time, so will start taking 3000 units once daily. -Will recheck vitamin D in 3 months.

## 2020-05-11 NOTE — Patient Instructions (Signed)

## 2020-05-11 NOTE — Assessment & Plan Note (Signed)
-   Last lipid panel: Total cholesterol, triglycerides, and LDL elevated. - Continue Crestor 10 mg. -Follow heart healthy/Mediterranean diet.

## 2020-06-28 ENCOUNTER — Encounter: Payer: Self-pay | Admitting: Emergency Medicine

## 2020-06-28 ENCOUNTER — Ambulatory Visit
Admission: EM | Admit: 2020-06-28 | Discharge: 2020-06-28 | Disposition: A | Discharge status: Home / Self Care | Payer: Medicare Other | Attending: Emergency Medicine | Admitting: Emergency Medicine

## 2020-06-28 ENCOUNTER — Other Ambulatory Visit: Payer: Self-pay

## 2020-06-28 DIAGNOSIS — H9202 Otalgia, left ear: Secondary | ICD-10-CM | POA: Diagnosis not present

## 2020-06-28 MED ORDER — ACETAMINOPHEN 500 MG PO TABS: 500.0000 mg | ORAL_TABLET | Freq: Four times a day (QID) | ORAL | 0 refills | Status: AC | PRN

## 2020-06-28 MED ORDER — FLUTICASONE PROPIONATE 50 MCG/ACT NA SUSP: 1.0000 | Freq: Every day | NASAL | 0 refills | Status: DC

## 2020-06-28 MED ORDER — CETIRIZINE HCL 10 MG PO TABS: 10.0000 mg | ORAL_TABLET | Freq: Every day | ORAL | 0 refills | Status: DC

## 2020-06-28 NOTE — ED Provider Notes (Signed)
[name]    CSN: [id number] Arrival date & time: 06/28/20  1419      History   Chief Complaint Chief Complaint  Patient presents with  . Otalgia    HPI Alejandra Alexander is a 79 y.o. female presenting for left ear pain since this morning.  Denies change in hearing, tinnitus, dizziness, fever, trauma, foreign body exposure or sensation.  Has not tried nothing for this.  Has h/o seasonal allergies.   Past Medical History:  Diagnosis Date  . Allergy    seasonal  . Anemia   . Chickenpox   . Hypertension   . Thyroid disease     Patient Active Problem List   Diagnosis Date Noted  . Vitamin D deficiency 03/11/2020  . Elevated TSH 03/11/2020  . Elevated lipids 03/11/2020  . Multinodular goiter 02/24/2020  . Hyperlipidemia 06/24/2019  . Hypertension 05/16/2018  . Healthcare maintenance 05/16/2018  . Allergic rhinitis 10/02/2013    History reviewed. No pertinent surgical history.  OB History   No obstetric history on file.      Home Medications    Prior to Admission medications   Medication Sig Start Date End Date Taking? Authorizing Provider  acetaminophen (TYLENOL) 500 MG tablet Take 1 tablet (500 mg total) by mouth every 6 (six) hours as needed. 06/28/20   Hall-Potvin, [place], PA-C  cetirizine (ZYRTEC ALLERGY) 10 MG tablet Take 1 tablet (10 mg total) by mouth daily. 06/28/20   Hall-Potvin, [place], PA-C  Cholecalciferol (VITAMIN D3) 25 MCG (1000 UT) CAPS Take 2 capsules by mouth 2 (two) times daily.    [provider]  fluticasone (FLONASE) 50 MCG/ACT nasal spray Place 1 spray into both nostrils daily. 06/28/20   Hall-Potvin, [place], PA-C  levothyroxine (SYNTHROID) 50 MCG tablet Take 1 tablet (50 mcg total) by mouth daily. 02/24/20   Romero Belling, MD  rosuvastatin (CRESTOR) 10 MG tablet Take 5 mg by mouth daily.    [provider]  valsartan (DIOVAN) 160 MG tablet TAKE 1 TABLET BY MOUTH DAILY. TO REPLACE LOSARTAN 12/01/19   Runell Gess, MD    Family History Family History  Problem Relation Age of Onset  . Heart disease Father   . Stroke Father     Social History Social History   Tobacco Use  . Smoking status: Never Smoker  . Smokeless tobacco: Never Used  Vaping Use  . Vaping Use: Never used  Substance Use Topics  . Alcohol use: No  . Drug use: No     Allergies   Lisinopril   Review of Systems As per HPI   Physical Exam Triage Vital Signs ED Triage Vitals  Enc Vitals Group     BP      Pulse      Resp      Temp      Temp src      SpO2      Weight      Height      Head Circumference      Peak Flow      Pain Score      Pain Loc      Pain Edu?      Excl. in GC?    No data found.  Updated Vital Signs BP (!) 179/68 (BP Location: Right Arm)   Pulse 87   Temp 99 F (37.2 C) (Oral)   Resp 18   SpO2 96%   Visual Acuity Right Eye Distance:   Left Eye  Distance:   Bilateral Distance:    Right Eye Near:   Left Eye Near:    Bilateral Near:     Physical Exam Constitutional:      General: She is not in acute distress. HENT:     Head: Normocephalic and atraumatic.     Right Ear: Tympanic membrane, ear canal and external ear normal.     Left Ear: Ear canal and external ear normal.     Ears:     Comments: Left TM retracted.  Bony landmarks intact.  No perforation.  Serous fluid without purulence.  No discharge or foreign body. Eyes:     General: No scleral icterus.       Right eye: No discharge.        Left eye: No discharge.     Conjunctiva/sclera: Conjunctivae normal.     Pupils: Pupils are equal, round, and reactive to light.  Cardiovascular:     Rate and Rhythm: Normal rate.  Pulmonary:     Effort: Pulmonary effort is normal.  Musculoskeletal:     Cervical back: Neck supple. No tenderness.  Lymphadenopathy:     Cervical: No cervical adenopathy.  Skin:    Coloration: Skin is not jaundiced or pale.  Neurological:     Mental Status: She is alert and oriented to  person, place, and time.      UC Treatments / Results  Labs (all labs ordered are listed, but only abnormal results are displayed) Labs Reviewed - No data to display  EKG   Radiology No results found.  Procedures Procedures (including critical care time)  Medications Ordered in UC Medications - No data to display  Initial Impression / Assessment and Plan / UC Course  I have reviewed the triage vital signs and the nursing notes.  Pertinent labs & imaging results that were available during my care of the patient were reviewed by me and considered in my medical decision making (see chart for details).     No infective process.  Supportive care as below.  Return precautions discussed, pt verbalized understanding and is agreeable to plan. Final Clinical Impressions(s) / UC Diagnoses   Final diagnoses:  Otalgia of left ear     Discharge Instructions     Return for worsening ear pain, swelling, discharge, bleeding, decreased hearing, development of jaw pain/swelling, fever.  Do NOT use Q-tips as these can cause your ear wax to get stuck, the tips may break off and become a foreign body requiring additional medical care, or puncture your eardrum.  Helpful prevention tip: Use a solution of equal parts isopropyl (rubbing) alcohol and white vinegar (acetic acid) in both ears after swimming.    ED Prescriptions    Medication Sig Dispense Auth. Provider   cetirizine (ZYRTEC ALLERGY) 10 MG tablet Take 1 tablet (10 mg total) by mouth daily. 30 tablet Hall-Potvin, [place], PA-C   fluticasone (FLONASE) 50 MCG/ACT nasal spray Place 1 spray into both nostrils daily. 16 g Hall-Potvin, [place], PA-C   acetaminophen (TYLENOL) 500 MG tablet Take 1 tablet (500 mg total) by mouth every 6 (six) hours as needed. 30 tablet Hall-Potvin, [place], PA-C     PDMP not reviewed this encounter.   Hall-Potvin, [place], [place] 06/28/20 1514

## 2020-06-28 NOTE — ED Triage Notes (Signed)
Pt here for left ear pain starting this am

## 2020-06-28 NOTE — Discharge Instructions (Addendum)
Return for worsening ear pain, swelling, discharge, bleeding, decreased hearing, development of jaw pain/swelling, fever.  Do NOT use Q-tips as these can cause your ear wax to get stuck, the tips may break off and become a foreign body requiring additional medical care, or puncture your eardrum.  Helpful prevention tip: Use a solution of equal parts isopropyl (rubbing) alcohol and white vinegar (acetic acid) in both ears after swimming. 

## 2020-06-30 ENCOUNTER — Other Ambulatory Visit: Payer: Self-pay | Admitting: Cardiovascular Disease

## 2020-07-28 ENCOUNTER — Other Ambulatory Visit: Payer: Self-pay | Admitting: Cardiovascular Disease

## 2020-07-29 ENCOUNTER — Other Ambulatory Visit: Payer: Self-pay | Admitting: Cardiovascular Disease

## 2020-07-30 ENCOUNTER — Other Ambulatory Visit: Payer: Self-pay | Admitting: Physician Assistant

## 2020-07-30 DIAGNOSIS — Z0000 Encounter for general adult medical examination without abnormal findings: Secondary | ICD-10-CM

## 2020-07-30 DIAGNOSIS — E782 Mixed hyperlipidemia: Secondary | ICD-10-CM

## 2020-07-30 DIAGNOSIS — R7989 Other specified abnormal findings of blood chemistry: Secondary | ICD-10-CM

## 2020-07-30 DIAGNOSIS — E785 Hyperlipidemia, unspecified: Secondary | ICD-10-CM

## 2020-07-30 DIAGNOSIS — I10 Essential (primary) hypertension: Secondary | ICD-10-CM

## 2020-08-03 ENCOUNTER — Other Ambulatory Visit: Payer: Self-pay

## 2020-08-03 ENCOUNTER — Other Ambulatory Visit: Payer: Medicare Other

## 2020-08-03 DIAGNOSIS — I10 Essential (primary) hypertension: Secondary | ICD-10-CM | POA: Diagnosis not present

## 2020-08-03 DIAGNOSIS — R7989 Other specified abnormal findings of blood chemistry: Secondary | ICD-10-CM | POA: Diagnosis not present

## 2020-08-03 DIAGNOSIS — E785 Hyperlipidemia, unspecified: Secondary | ICD-10-CM | POA: Diagnosis not present

## 2020-08-03 DIAGNOSIS — Z0000 Encounter for general adult medical examination without abnormal findings: Secondary | ICD-10-CM | POA: Diagnosis not present

## 2020-08-03 DIAGNOSIS — E782 Mixed hyperlipidemia: Secondary | ICD-10-CM

## 2020-08-04 LAB — HEMOGLOBIN A1C
Est. average glucose Bld gHb Est-mCnc: 114 mg/dL
Hgb A1c MFr Bld: 5.6 % (ref 4.8–5.6)

## 2020-08-04 LAB — LIPID PANEL
Chol/HDL Ratio: 2.6 ratio (ref 0.0–4.4)
Cholesterol, Total: 149 mg/dL (ref 100–199)
HDL: 58 mg/dL (ref >39)
LDL Chol Calc (NIH): 71 mg/dL (ref 0–99)
Triglycerides: 110 mg/dL (ref 0–149)
VLDL Cholesterol Cal: 20 mg/dL (ref 5–40)

## 2020-08-04 LAB — COMPREHENSIVE METABOLIC PANEL
ALT: 14 IU/L (ref 0–32)
AST: 16 IU/L (ref 0–40)
Albumin/Globulin Ratio: 2.2 (ref 1.2–2.2)
Albumin: 4.7 g/dL (ref 3.7–4.7)
Alkaline Phosphatase: 88 IU/L (ref 44–121)
BUN/Creatinine Ratio: 14 (ref 12–28)
BUN: 12 mg/dL (ref 8–27)
Bilirubin Total: 0.6 mg/dL (ref 0.0–1.2)
CO2: 23 mmol/L (ref 20–29)
Calcium: 9.7 mg/dL (ref 8.7–10.3)
Chloride: 107 mmol/L — ABNORMAL HIGH (ref 96–106)
Creatinine, Ser: 0.88 mg/dL (ref 0.57–1.00)
GFR calc Af Amer: 72 mL/min/{1.73_m2} (ref >59)
GFR calc non Af Amer: 63 mL/min/{1.73_m2} (ref >59)
Globulin, Total: 2.1 g/dL (ref 1.5–4.5)
Glucose: 98 mg/dL (ref 65–99)
Potassium: 4.4 mmol/L (ref 3.5–5.2)
Sodium: 145 mmol/L — ABNORMAL HIGH (ref 134–144)
Total Protein: 6.8 g/dL (ref 6.0–8.5)

## 2020-08-04 LAB — CBC
Hematocrit: 41.9 % (ref 34.0–46.6)
Hemoglobin: 14.1 g/dL (ref 11.1–15.9)
MCH: 29.4 pg (ref 26.6–33.0)
MCHC: 33.7 g/dL (ref 31.5–35.7)
MCV: 88 fL (ref 79–97)
Platelets: 244 10*3/uL (ref 150–450)
RBC: 4.79 x10E6/uL (ref 3.77–5.28)
RDW: 12.8 % (ref 11.7–15.4)
WBC: 5.2 10*3/uL (ref 3.4–10.8)

## 2020-08-04 LAB — TSH: TSH: 2.22 u[IU]/mL (ref 0.450–4.500)

## 2020-08-10 ENCOUNTER — Other Ambulatory Visit: Payer: Self-pay

## 2020-08-10 ENCOUNTER — Ambulatory Visit (INDEPENDENT_AMBULATORY_CARE_PROVIDER_SITE_OTHER): Payer: Medicare Other | Admitting: Physician Assistant

## 2020-08-10 ENCOUNTER — Encounter: Payer: Self-pay | Admitting: Physician Assistant

## 2020-08-10 VITALS — BP 146/73 | HR 76 | Ht 62.0 in | Wt 127.0 lb

## 2020-08-10 DIAGNOSIS — E782 Mixed hyperlipidemia: Secondary | ICD-10-CM

## 2020-08-10 DIAGNOSIS — I10 Essential (primary) hypertension: Secondary | ICD-10-CM

## 2020-08-10 DIAGNOSIS — E042 Nontoxic multinodular goiter: Secondary | ICD-10-CM | POA: Diagnosis not present

## 2020-08-10 NOTE — Assessment & Plan Note (Signed)
-  Followed by Endocrinology. -Most recent TSH wnl -Continue current medication regimen.

## 2020-08-10 NOTE — Assessment & Plan Note (Signed)
-  Most recent lipid panel significantly improved. -Continue current medication regimen. -Continue to follow a heart healthy diet low in saturated and trans fats. -Continue walking regimen.

## 2020-08-10 NOTE — Progress Notes (Signed)
Established Patient Office Visit  Subjective:  Patient ID: Alejandra Alexander, female    DOB: Nov 04, 1941  Age: 79 y.o. MRN: [id number]  CC:  Chief Complaint  Patient presents with   Hypertension   Hyperlipidemia    HPI Alejandra Alexander presents for follow up on hypertension and hyperlipidemia. Has no acute concerns or questions today.   HTN: Pt followed by endocrinology. Denies chest pain, palpitations, dizziness or lower extremity swelling. Taking medication as directed without side effects. Doesn't check BP at home. States she does get nervous when going to a medical office and her blood pressure is usually high. Pt follows a low salt diet. Reports she has started walking again for about 1 hr 4-5 times per week.  HLD: Reports medication is managed by Cardiology. Pt taking medication as directed without issues. Denies side effects including myalgias and RUQ pain. States she has made dietary changes and is reading labels to avoid/reduce saturated and trans fats.      Past Medical History:  Diagnosis Date   Allergy    seasonal   Anemia    Chickenpox    Hypertension    Thyroid disease     History reviewed. No pertinent surgical history.  Family History  Problem Relation Age of Onset   Heart disease Father    Stroke Father     Social History   Socioeconomic History   Marital status: Widowed    Spouse name: Not on file   Number of children: Not on file   Years of education: Not on file   Highest education level: Not on file  Occupational History   Not on file  Tobacco Use   Smoking status: Never Smoker   Smokeless tobacco: Never Used  Vaping Use   Vaping Use: Never used  Substance and Sexual Activity   Alcohol use: No   Drug use: No   Sexual activity: Not Currently  Other Topics Concern   Not on file  Social History Narrative   Not on file   Social Determinants of Health   Financial Resource Strain:    Difficulty of Paying Living  Expenses: Not on file  Food Insecurity:    Worried About Running Out of Food in the Last Year: Not on file   Ran Out of Food in the Last Year: Not on file  Transportation Needs:    Lack of Transportation (Medical): Not on file   Lack of Transportation (Non-Medical): Not on file  Physical Activity:    Days of Exercise per Week: Not on file   Minutes of Exercise per Session: Not on file  Stress:    Feeling of Stress : Not on file  Social Connections:    Frequency of Communication with Friends and Family: Not on file   Frequency of Social Gatherings with Friends and Family: Not on file   Attends Religious Services: Not on file   Active Member of Clubs or Organizations: Not on file   Attends Banker Meetings: Not on file   Marital Status: Not on file  Intimate Partner Violence:    Fear of Current or Ex-Partner: Not on file   Emotionally Abused: Not on file   Physically Abused: Not on file   Sexually Abused: Not on file    Outpatient Medications Prior to Visit  Medication Sig Dispense Refill   acetaminophen (TYLENOL) 500 MG tablet Take 1 tablet (500 mg total) by mouth every 6 (six) hours as needed. 30 tablet 0  cetirizine (ZYRTEC ALLERGY) 10 MG tablet Take 1 tablet (10 mg total) by mouth daily. 30 tablet 0   Cholecalciferol (VITAMIN D3) 25 MCG (1000 UT) CAPS Take 2 capsules by mouth 2 (two) times daily.     fluticasone (FLONASE) 50 MCG/ACT nasal spray Place 1 spray into both nostrils daily. 16 g 0   levothyroxine (SYNTHROID) 50 MCG tablet Take 1 tablet (50 mcg total) by mouth daily. 90 tablet 3   rosuvastatin (CRESTOR) 10 MG tablet Take 5 mg by mouth daily.     valsartan (DIOVAN) 160 MG tablet TAKE 1 TABLET BY MOUTH DAILY. 30 tablet 0   No facility-administered medications prior to visit.    Allergies  Allergen Reactions   Lisinopril Other (See Comments)    Weakness, visual disturbances    ROS Review of Systems  A fourteen system  review of systems was performed and found to be positive as per HPI.   Objective:    Physical Exam General:  Well Developed, well nourished, appropriate for stated age.  Neuro:  Alert and oriented,  No focal deficits HEENT:  Normocephalic, atraumatic, neck supple Skin:  no gross rash, warm, pink. Cardiac:  RRR, S1 S2 Respiratory:  ECTA B/L and A/P, Not using accessory muscles, speaking in full sentences- unlabored. Vascular:  Ext warm, no cyanosis apprec.; cap RF less 2 sec. Psych:  No HI/SI, judgement and insight good, Euthymic mood. Full Affect.   BP (!) 146/73    Pulse 76    Ht 5\' 2"  (1.575 m)    Wt 127 lb (57.6 kg)    SpO2 98%    BMI 23.23 kg/m  Wt Readings from Last 3 Encounters:  08/10/20 127 lb (57.6 kg)  05/11/20 128 lb 11.2 oz (58.4 kg)  02/24/20 128 lb (58.1 kg)     Health Maintenance Due  Topic Date Due   Hepatitis C Screening  Never done   TETANUS/TDAP  Never done   DEXA SCAN  Never done   INFLUENZA VACCINE  06/06/2020    There are no preventive care reminders to display for this patient.  Lab Results  Component Value Date   TSH 2.220 08/03/2020   Lab Results  Component Value Date   WBC 5.2 08/03/2020   HGB 14.1 08/03/2020   HCT 41.9 08/03/2020   MCV 88 08/03/2020   PLT 244 08/03/2020   Lab Results  Component Value Date   NA 145 (H) 08/03/2020   K 4.4 08/03/2020   CO2 23 08/03/2020   GLUCOSE 98 08/03/2020   BUN 12 08/03/2020   CREATININE 0.88 08/03/2020   BILITOT 0.6 08/03/2020   ALKPHOS 88 08/03/2020   AST 16 08/03/2020   ALT 14 08/03/2020   PROT 6.8 08/03/2020   ALBUMIN 4.7 08/03/2020   CALCIUM 9.7 08/03/2020   Lab Results  Component Value Date   CHOL 149 08/03/2020   Lab Results  Component Value Date   HDL 58 08/03/2020   Lab Results  Component Value Date   LDLCALC 71 08/03/2020   Lab Results  Component Value Date   TRIG 110 08/03/2020   Lab Results  Component Value Date   CHOLHDL 2.6 08/03/2020   Lab Results   Component Value Date   HGBA1C 5.6 08/03/2020      Assessment & Plan:   Problem List Items Addressed This Visit      Cardiovascular and Mediastinum   Hypertension - Primary (Chronic)    -Followed by Cardiology. -Per chart review, patient's BP usually  elevated in the office so likely component of white coat syndrome. -Encourage to monitor BP and pulse at home to ensure BP staying stable (<140/90). -Continue current medication regimen and to stay active. -Most recent CMP wnl with mildly elevated sodium and will recheck next OV.        Endocrine   Multinodular goiter    -Followed by Endocrinology. -Most recent TSH wnl -Continue current medication regimen.        Other   Hyperlipidemia    -Most recent lipid panel significantly improved. -Continue current medication regimen. -Continue to follow a heart healthy diet low in saturated and trans fats. -Continue walking regimen.          No orders of the defined types were placed in this encounter.   Follow-up: Return in about 3 months (around 11/10/2020) for MCW (repeat cmp, vit d).    Mayer Masker, PA-C

## 2020-08-10 NOTE — Patient Instructions (Addendum)
Managing Your Hypertension °Hypertension is commonly called high blood pressure. This is when the force of your blood pressing against the walls of your arteries is too strong. Arteries are blood vessels that carry blood from your heart throughout your body. Hypertension forces the heart to work harder to pump blood, and may cause the arteries to become narrow or stiff. Having untreated or uncontrolled hypertension can cause heart attack, stroke, kidney disease, and other problems. °What are blood pressure readings? °A blood pressure reading consists of a higher number over a lower number. Ideally, your blood pressure should be below 120/80. The first ("top") number is called the systolic pressure. It is a measure of the pressure in your arteries as your heart beats. The second ("bottom") number is called the diastolic pressure. It is a measure of the pressure in your arteries as the heart relaxes. °What does my blood pressure reading mean? °Blood pressure is classified into four stages. Based on your blood pressure reading, your health care provider may use the following stages to determine what type of treatment you need, if any. Systolic pressure and diastolic pressure are measured in a unit called mm Hg. °Normal °· Systolic pressure: below 120. °· Diastolic pressure: below 80. °Elevated °· Systolic pressure: 120-129. °· Diastolic pressure: below 80. °Hypertension stage 1 °· Systolic pressure: 130-139. °· Diastolic pressure: 80-89. °Hypertension stage 2 °· Systolic pressure: 140 or above. °· Diastolic pressure: 90 or above. °What health risks are associated with hypertension? °Managing your hypertension is an important responsibility. Uncontrolled hypertension can lead to: °· A heart attack. °· A stroke. °· A weakened blood vessel (aneurysm). °· Heart failure. °· Kidney damage. °· Eye damage. °· Metabolic syndrome. °· Memory and concentration problems. °What changes can I make to manage my  hypertension? °Hypertension can be managed by making lifestyle changes and possibly by taking medicines. Your health care provider will help you make a plan to bring your blood pressure within a normal range. °Eating and drinking ° °· Eat a diet that is high in fiber and potassium, and low in salt (sodium), added sugar, and fat. An example eating plan is called the DASH (Dietary Approaches to Stop Hypertension) diet. To eat this way: °? Eat plenty of fresh fruits and vegetables. Try to fill half of your plate at each meal with fruits and vegetables. °? Eat whole grains, such as whole wheat pasta, brown rice, or whole grain bread. Fill about one quarter of your plate with whole grains. °? Eat low-fat diary products. °? Avoid fatty cuts of meat, processed or cured meats, and poultry with skin. Fill about one quarter of your plate with lean proteins such as fish, chicken without skin, beans, eggs, and tofu. °? Avoid premade and processed foods. These tend to be higher in sodium, added sugar, and fat. °· Reduce your daily sodium intake. Most people with hypertension should eat less than 1,500 mg of sodium a day. °· Limit alcohol intake to no more than 1 drink a day for nonpregnant women and 2 drinks a day for men. One drink equals 12 oz of beer, 5 oz of wine, or 1½ oz of hard liquor. °Lifestyle °· Work with your health care provider to maintain a healthy body weight, or to lose weight. Ask what an ideal weight is for you. °· Get at least 30 minutes of exercise that causes your heart to beat faster (aerobic exercise) most days of the week. Activities may include walking, swimming, or biking. °· Include exercise   to strengthen your muscles (resistance exercise), such as weight lifting, as part of your weekly exercise routine. Try to do these types of exercises for 30 minutes at least 3 days a week. °· Do not use any products that contain nicotine or tobacco, such as cigarettes and e-cigarettes. If you need help quitting,  ask your health care provider. °· Control any long-term (chronic) conditions you have, such as high cholesterol or diabetes. °Monitoring °· Monitor your blood pressure at home as told by your health care provider. Your personal target blood pressure may vary depending on your medical conditions, your age, and other factors. °· Have your blood pressure checked regularly, as often as told by your health care provider. °Working with your health care provider °· Review all the medicines you take with your health care provider because there may be side effects or interactions. °· Talk with your health care provider about your diet, exercise habits, and other lifestyle factors that may be contributing to hypertension. °· Visit your health care provider regularly. Your health care provider can help you create and adjust your plan for managing hypertension. °Will I need medicine to control my blood pressure? °Your health care provider may prescribe medicine if lifestyle changes are not enough to get your blood pressure under control, and if: °· Your systolic blood pressure is 130 or higher. °· Your diastolic blood pressure is 80 or higher. °Take medicines only as told by your health care provider. Follow the directions carefully. Blood pressure medicines must be taken as prescribed. The medicine does not work as well when you skip doses. Skipping doses also puts you at risk for problems. °Contact a health care provider if: °· You think you are having a reaction to medicines you have taken. °· You have repeated (recurrent) headaches. °· You feel dizzy. °· You have swelling in your ankles. °· You have trouble with your vision. °Get help right away if: °· You develop a severe headache or confusion. °· You have unusual weakness or numbness, or you feel faint. °· You have severe pain in your chest or abdomen. °· You vomit repeatedly. °· You have trouble breathing. °Summary °· Hypertension is when the force of blood pumping  through your arteries is too strong. If this condition is not controlled, it may put you at risk for serious complications. °· Your personal target blood pressure may vary depending on your medical conditions, your age, and other factors. For most people, a normal blood pressure is less than 120/80. °· Hypertension is managed by lifestyle changes, medicines, or both. Lifestyle changes include weight loss, eating a healthy, low-sodium diet, exercising more, and limiting alcohol. °This information is not intended to replace advice given to you by your health care provider. Make sure you discuss any questions you have with your health care provider. °Document Revised: 02/14/2019 Document Reviewed: 09/20/2016 °Elsevier Patient Education © 2020 [employer] ° °DASH Eating Plan °DASH stands for "Dietary Approaches to Stop Hypertension." The DASH eating plan is a healthy eating plan that has been shown to reduce high blood pressure (hypertension). It may also reduce your risk for type 2 diabetes, heart disease, and stroke. The DASH eating plan may also help with weight loss. °What are tips for following this plan? ° °General guidelines °· Avoid eating more than 2,300 mg (milligrams) of salt (sodium) a day. If you have hypertension, you may need to reduce your sodium intake to 1,500 mg a day. °· Limit alcohol intake to no more than   1 drink a day for nonpregnant women and 2 drinks a day for men. One drink equals 12 oz of beer, 5 oz of wine, or 1½ oz of hard liquor. °· Work with your health care provider to maintain a healthy body weight or to lose weight. Ask what an ideal weight is for you. °· Get at least 30 minutes of exercise that causes your heart to beat faster (aerobic exercise) most days of the week. Activities may include walking, swimming, or biking. °· Work with your health care provider or diet and nutrition specialist (dietitian) to adjust your eating plan to your individual calorie needs. °Reading food  labels ° °· Check food labels for the amount of sodium per serving. Choose foods with less than 5 percent of the Daily Value of sodium. Generally, foods with less than 300 mg of sodium per serving fit into this eating plan. °· To find whole grains, look for the word "whole" as the first word in the ingredient list. °Shopping °· Buy products labeled as "low-sodium" or "no salt added." °· Buy fresh foods. Avoid canned foods and premade or frozen meals. °Cooking °· Avoid adding salt when cooking. Use salt-free seasonings or herbs instead of table salt or sea salt. Check with your health care provider or pharmacist before using salt substitutes. °· Do not fry foods. Cook foods using healthy methods such as baking, boiling, grilling, and broiling instead. °· Cook with heart-healthy oils, such as olive, canola, soybean, or sunflower oil. °Meal planning °· Eat a balanced diet that includes: °? 5 or more servings of fruits and vegetables each day. At each meal, try to fill half of your plate with fruits and vegetables. °? Up to 6-8 servings of whole grains each day. °? Less than 6 oz of lean meat, poultry, or fish each day. A 3-oz serving of meat is about the same size as a deck of cards. One egg equals 1 oz. °? 2 servings of low-fat dairy each day. °? A serving of nuts, seeds, or beans 5 times each week. °? Heart-healthy fats. Healthy fats called Omega-3 fatty acids are found in foods such as flaxseeds and coldwater fish, like sardines, salmon, and mackerel. °· Limit how much you eat of the following: °? Canned or prepackaged foods. °? Food that is high in trans fat, such as fried foods. °? Food that is high in saturated fat, such as fatty meat. °? Sweets, desserts, sugary drinks, and other foods with added sugar. °? Full-fat dairy products. °· Do not salt foods before eating. °· Try to eat at least 2 vegetarian meals each week. °· Eat more home-cooked food and less restaurant, buffet, and fast food. °· When eating at a  restaurant, ask that your food be prepared with less salt or no salt, if possible. °What foods are recommended? °The items listed may not be a complete list. Talk with your dietitian about what dietary choices are best for you. °Grains °Whole-grain or whole-wheat bread. Whole-grain or whole-wheat pasta. Brown rice. Oatmeal. Quinoa. Bulgur. Whole-grain and low-sodium cereals. Pita bread. Low-fat, low-sodium crackers. Whole-wheat flour tortillas. °Vegetables °Fresh or frozen vegetables (raw, steamed, roasted, or grilled). Low-sodium or reduced-sodium tomato and vegetable juice. Low-sodium or reduced-sodium tomato sauce and tomato paste. Low-sodium or reduced-sodium canned vegetables. °Fruits °All fresh, dried, or frozen fruit. Canned fruit in natural juice (without added sugar). °Meat and other protein foods °Skinless chicken or turkey. Ground chicken or turkey. Pork with fat trimmed off. Fish and seafood. Egg whites.   Dried beans, peas, or lentils. Unsalted nuts, nut butters, and seeds. Unsalted canned beans. Lean cuts of beef with fat trimmed off. Low-sodium, lean deli meat. °Dairy °Low-fat (1%) or fat-free (skim) milk. Fat-free, low-fat, or reduced-fat cheeses. Nonfat, low-sodium ricotta or cottage cheese. Low-fat or nonfat yogurt. Low-fat, low-sodium cheese. °Fats and oils °Soft margarine without trans fats. Vegetable oil. Low-fat, reduced-fat, or light mayonnaise and salad dressings (reduced-sodium). Canola, safflower, olive, soybean, and sunflower oils. Avocado. °Seasoning and other foods °Herbs. Spices. Seasoning mixes without salt. Unsalted popcorn and pretzels. Fat-free sweets. °What foods are not recommended? °The items listed may not be a complete list. Talk with your dietitian about what dietary choices are best for you. °Grains °Baked goods made with fat, such as croissants, muffins, or some breads. Dry pasta or rice meal packs. °Vegetables °Creamed or fried vegetables. Vegetables in a cheese sauce.  Regular canned vegetables (not low-sodium or reduced-sodium). Regular canned tomato sauce and paste (not low-sodium or reduced-sodium). Regular tomato and vegetable juice (not low-sodium or reduced-sodium). Pickles. Olives. °Fruits °Canned fruit in a light or heavy syrup. Fried fruit. Fruit in cream or butter sauce. °Meat and other protein foods °Fatty cuts of meat. Ribs. Fried meat. Bacon. Sausage. Bologna and other processed lunch meats. Salami. Fatback. Hotdogs. Bratwurst. Salted nuts and seeds. Canned beans with added salt. Canned or smoked fish. Whole eggs or egg yolks. Chicken or turkey with skin. °Dairy °Whole or 2% milk, cream, and half-and-half. Whole or full-fat cream cheese. Whole-fat or sweetened yogurt. Full-fat cheese. Nondairy creamers. Whipped toppings. Processed cheese and cheese spreads. °Fats and oils °Butter. Stick margarine. Lard. Shortening. Ghee. Bacon fat. Tropical oils, such as coconut, palm kernel, or palm oil. °Seasoning and other foods °Salted popcorn and pretzels. Onion salt, garlic salt, seasoned salt, table salt, and sea salt. Worcestershire sauce. Tartar sauce. Barbecue sauce. Teriyaki sauce. Soy sauce, including reduced-sodium. Steak sauce. Canned and packaged gravies. Fish sauce. Oyster sauce. Cocktail sauce. Horseradish that you find on the shelf. Ketchup. Mustard. Meat flavorings and tenderizers. Bouillon cubes. Hot sauce and Tabasco sauce. Premade or packaged marinades. Premade or packaged taco seasonings. Relishes. Regular salad dressings. °Where to find more information: °· National Heart, Lung, and Blood Institute: www.nhlbi.nih.gov °· American Heart Association: www.heart.org °Summary °· The DASH eating plan is a healthy eating plan that has been shown to reduce high blood pressure (hypertension). It may also reduce your risk for type 2 diabetes, heart disease, and stroke. °· With the DASH eating plan, you should limit salt (sodium) intake to 2,300 mg a day. If you have  hypertension, you may need to reduce your sodium intake to 1,500 mg a day. °· When on the DASH eating plan, aim to eat more fresh fruits and vegetables, whole grains, lean proteins, low-fat dairy, and heart-healthy fats. °· Work with your health care provider or diet and nutrition specialist (dietitian) to adjust your eating plan to your individual calorie needs. °This information is not intended to replace advice given to you by your health care provider. Make sure you discuss any questions you have with your health care provider. °Document Revised: 10/05/2017 Document Reviewed: 10/16/2016 °Elsevier Patient Education © 2020 Elsevier Inc. ° °

## 2020-08-10 NOTE — Assessment & Plan Note (Addendum)
-  Followed by Cardiology. -Per chart review, patient's BP usually elevated in the office so likely component of white coat syndrome. -Encourage to monitor BP and pulse at home to ensure BP staying stable (<140/90). -Continue current medication regimen and to stay active. -Most recent CMP wnl with mildly elevated sodium and will recheck next OV.

## 2020-08-13 ENCOUNTER — Other Ambulatory Visit: Payer: Self-pay | Admitting: Physician Assistant

## 2020-08-13 MED ORDER — ROSUVASTATIN CALCIUM 10 MG PO TABS
5.0000 mg | ORAL_TABLET | Freq: Every day | ORAL | 0 refills | Status: DC
Start: 2020-08-13 — End: 2020-10-15

## 2020-08-13 NOTE — Telephone Encounter (Signed)
*  STAT* If patient is at the pharmacy, call can be transferred to refill team.   1. Which medications need to be refilled? (please list name of each medication and dose if known)  rosuvastatin (CRESTOR) 10 MG tablet  2. Which pharmacy/location (including street and city if local pharmacy) is medication to be sent to? [facility] - [place], [place] - [address]  3. Do they need a 30 day or 90 day supply? 90 with refills   Pt is out of medication

## 2020-08-28 ENCOUNTER — Other Ambulatory Visit: Payer: Self-pay | Admitting: Cardiovascular Disease

## 2020-08-30 NOTE — Telephone Encounter (Signed)
*  STAT* If patient is at the pharmacy, call can be transferred to refill team.   1. Which medications need to be refilled? (please list name of each medication and dose if known) valsartan (DIOVAN) 160 MG tablet  2. Which pharmacy/location (including street and city if local pharmacy) is medication to be sent to? [facility] - [place], [place] - [address]  3. Do they need a 30 day or 90 day supply? 90 day Patient is out of meds.

## 2020-09-02 ENCOUNTER — Other Ambulatory Visit: Payer: Self-pay | Admitting: Cardiovascular Disease

## 2020-09-02 MED ORDER — VALSARTAN 160 MG PO TABS
160.0000 mg | ORAL_TABLET | Freq: Every day | ORAL | 0 refills | Status: DC
Start: 2020-09-02 — End: 2020-12-13

## 2020-09-02 NOTE — Telephone Encounter (Signed)
°*  STAT* If patient is at the pharmacy, call can be transferred to refill team.   1. Which medications need to be refilled? (please list name of each medication and dose if known)  valsartan (DIOVAN) 160 MG tablet  2. Which pharmacy/location (including street and city if local pharmacy) is medication to be sent to? [facility] - [place], [place] - [address]  3. Do they need a 30 day or 90 day supply? 30 day   Patient has an appointment 10/18/20

## 2020-09-02 NOTE — Telephone Encounter (Signed)
Patient called to check on status of refill request.

## 2020-09-02 NOTE — Telephone Encounter (Signed)
Rx has been sent to the pharmacy electronically. ° °

## 2020-10-15 ENCOUNTER — Other Ambulatory Visit: Payer: Self-pay | Admitting: Physician Assistant

## 2020-10-16 NOTE — Progress Notes (Signed)
Cardiology Office Note:    Date:  10/18/2020   ID:  ASLEE SUCH, DOB 02/17/1941, MRN [id number]  PCP:  Mayer Masker, PA-C   Cardiologist:  Nanetta Batty, MD  Electrophysiologist:  None   Referring MD: Mayer Masker, PA-C   Chief Complaint: follow-up of hypertension  History of Present Illness:    Alejandra Alexander is a 79 y.o. female with a history of hypertension, hyperlipidemia, and hypothyroidism but no known cardiac disease who is followed by Dr. Allyson Sabal and presents today for routine follow-up.   Patient was referred to Dr. Allyson Sabal in 06/2019 for further evaluation of hypertension. Since then she has been seen in our [facility] to establish a good antihypertensive regimen. She has been intolerant of Lisinopril and Amlodipine. She was previously on Losartan but this was ultimately switched to Valsartan. HCTZ was also considered but patient wanted to wait on starting this as she is sensitive to medications. Patient was last seen by Bettina Gavia, PA-C, for a virtual visit in 09/2019 at which time she was doing well from a cardiac standpoint. She was tolerating Valsartan well and her BP had been in the 130's/70's. She was started on Crestor 5mg  daily due to most recent lipid panel at PCP's office which showed LDL of 143.  Patient presents today for follow-up. Here alone. BP elevated at 150/82. She states she has a BP machine at home but has not been routinely monitoring it. She denies any chest pain, shortness of breath, orthopnea, PND, edema, palpitations, lightheadedness, dizziness, syncope. She is staying very active and walks about 4 miles almost every day. Her only complaint today is congestion in her ears. No ear pain. Recommended taking OTC allergy medications. She is already on Zyrtec as needed. Recommended taking this daily for the next couple of weeks.   Past Medical History:  Diagnosis Date  . Allergy    seasonal  . Anemia   . Chickenpox   . Hypertension   .  Thyroid disease     History reviewed. No pertinent surgical history.  Current Medications: Current Meds  Medication Sig  . acetaminophen (TYLENOL) 500 MG tablet Take 1 tablet (500 mg total) by mouth every 6 (six) hours as needed.  . cetirizine (ZYRTEC ALLERGY) 10 MG tablet Take 1 tablet (10 mg total) by mouth daily.  . Cholecalciferol (VITAMIN D3) 25 MCG (1000 UT) CAPS Take 2 capsules by mouth 2 (two) times daily.  . fluticasone (FLONASE) 50 MCG/ACT nasal spray Place 1 spray into both nostrils daily.  levothyroxine (SYNTHROID) 50 MCG tablet Take 1 tablet (50 mcg total) by mouth daily.  . valsartan (DIOVAN) 160 MG tablet Take 1 tablet (160 mg total) by mouth daily. Please keep f/u appt in December  . [DISCONTINUED] rosuvastatin (CRESTOR) 10 MG tablet TAKE 1/2 TABLET BY MOUTH DAILY. NEED OFFICE VISIT     Allergies:   Lisinopril   Social History   Socioeconomic History  . Marital status: Widowed    Spouse name: Not on file  . Number of children: Not on file  . Years of education: Not on file  . Highest education level: Not on file  Occupational History  . Not on file  Tobacco Use  . Smoking status: Never Smoker  . Smokeless tobacco: Never Used  Vaping Use  . Vaping Use: Never used  Substance and Sexual Activity  . Alcohol use: No  . Drug use: No  . Sexual activity: Not Currently  Other Topics Concern  .  Not on file  Social History Narrative  . Not on file   Social Determinants of Health   Financial Resource Strain: Not on file  Food Insecurity: Not on file  Transportation Needs: Not on file  Physical Activity: Not on file  Stress: Not on file  Social Connections: Not on file     Family History: The patient's family history includes Heart disease in her father; Stroke in her father.  ROS:   Please see the history of present illness.     EKGs/Labs/Other Studies Reviewed:    The following studies were reviewed today: N/A  EKG:  EKG ordered today. EKG  personally reviewed and demonstrates normal sinus rhythm, rate 88 bpm, with T wave inversions in leads III and aVF (new compared to EKG in 06/2019). Normal axis. PR and QRS intervals normal. QTc 425 ms.   Recent Labs: 08/03/2020: ALT 14; BUN 12; Creatinine, Ser 0.88; Hemoglobin 14.1; Platelets 244; Potassium 4.4; Sodium 145; TSH 2.220  Recent Lipid Panel    Component Value Date/Time   CHOL 149 08/03/2020 0826   TRIG 110 08/03/2020 0826   HDL 58 08/03/2020 0826   CHOLHDL 2.6 08/03/2020 0826   LDLCALC 71 08/03/2020 0826    Physical Exam:    Vital Signs: BP (!) 150/82   Pulse 88   Ht 5\' 2"  (1.575 m)   Wt 128 lb (58.1 kg)   SpO2 97%   BMI 23.41 kg/m     Wt Readings from Last 3 Encounters:  10/18/20 128 lb (58.1 kg)  08/10/20 127 lb (57.6 kg)  05/11/20 128 lb 11.2 oz (58.4 kg)     General: 79 y.o. female in no acute distress. HEENT: Normocephalic and atraumatic. Sclera clear.  Neck: Supple. No carotid bruits. No JVD. Heart: RRR. Distinct S1 and S2. No murmurs, gallops, or rubs. Radial pulses 2+ and equal bilaterally. Lungs: No increased work of breathing. Clear to ausculation bilaterally. No wheezes, rhonchi, or rales.  Abdomen: Soft, non-distended, and non-tender to palpation.  MSK: Normal strength and tone for age.  Extremities: No lower extremity edema.    Skin: Warm and dry. Neuro: Alert and oriented x3. No focal deficits. Psych: Normal affect. Responds appropriately.   Assessment:    1. Primary hypertension   2. Hyperlipidemia, unspecified hyperlipidemia type   3. Nonspecific abnormal electrocardiogram (ECG) (EKG)     Plan:    Hypertension - BP elevated at 150/82 today. She has not been monitoring her BP at home.  - Continue Valsartan 160mg  daily for now. She takes her Valsartan at night. Have asked patient to keep BP/HR log for 2 weeks. Patient to check her BP/HR once in the morning and then again at night 2 hours after taking Valsartan. - If BP consistently  above BP goal of 130/80, can increase Valsartan to 320mg  daily. - Renal function and electrolytes stable on CMET in 07/2020.   Hyperlipidemia - Most recent lipid panel from 07/2020: Total Cholesterol 149, Triglycerides 110, HDL 58, LDL 71.  - Crestor 5mg  daily listed under home medications. However, patient states she was told by PCP to take full tablet which is 10mg  daily. Will refill this today per patient's request. - Labs followed by PCP.  Abnormal EKG - EKG showed new T wave inversions in leads III and aVF compared to prior EKG from 06/2019. - Patient any chest pain. She walks 4 miles almost every day without any anginal symptoms.  - Do not think any additional evaluation is needed at this  time. However, patient will notify us if she develops any chest pain or new onset dyspnea.   Disposition: Follow up in 1 year with Dr. Allyson Sabal.   Medication Adjustments/Labs and Tests Ordered: Current medicines are reviewed at length with the patient today.  Concerns regarding medicines are outlined above.  Orders Placed This Encounter  Procedures  . EKG 12-Lead   Meds ordered this encounter  Medications  . rosuvastatin (CRESTOR) 10 MG tablet    Sig: Take 1 tablet (10 mg total) by mouth daily.    Dispense:  90 tablet    Refill:  3    Patient Instructions  Medication Instructions:  Start Crestor 10mg  (1 Tablet in the Evening) *If you need a refill on your cardiac medications before your next appointment, please call your pharmacy*   Lab Work: No Labs If you have labs (blood work) drawn today and your tests are completely normal, you will receive your results only by: MyChart Message (if you have MyChart) OR . A paper copy in the mail If you have any lab test that is abnormal or we need to change your treatment, we will call you to review the results.   Testing/Procedures: No Testing    Follow-Up: At [facility], you and your health needs are our priority.  As part of our  continuing mission to provide you with exceptional heart care, we have created designated Provider Care Teams.  These Care Teams include your primary Cardiologist (physician) and Advanced Practice Providers (APPs -  Physician Assistants and Nurse Practitioners) who all work together to provide you with the care you need, when you need it.    Your next appointment:   1 year(s)  The format for your next appointment:   In Person  Provider:   [facility], MD   Other Instructions Log of Blood Pressure and Heart Rate Twice Daily. In the AM after taking Synthroid. And PM 2 hours  after taking Valsartan and Crestor    Signed, Nanetta Batty, PA-C  10/18/2020 10:15 AM    [facility]

## 2020-10-18 ENCOUNTER — Other Ambulatory Visit: Payer: Self-pay

## 2020-10-18 ENCOUNTER — Encounter: Payer: Self-pay | Admitting: Student

## 2020-10-18 ENCOUNTER — Ambulatory Visit: Payer: Medicare Other | Admitting: Student

## 2020-10-18 VITALS — BP 150/82 | HR 88 | Ht 62.0 in | Wt 128.0 lb

## 2020-10-18 DIAGNOSIS — E785 Hyperlipidemia, unspecified: Secondary | ICD-10-CM

## 2020-10-18 DIAGNOSIS — R9431 Abnormal electrocardiogram [ECG] [EKG]: Secondary | ICD-10-CM

## 2020-10-18 DIAGNOSIS — I10 Essential (primary) hypertension: Secondary | ICD-10-CM

## 2020-10-18 MED ORDER — ROSUVASTATIN CALCIUM 10 MG PO TABS: 10.0000 mg | ORAL_TABLET | Freq: Every day | ORAL | 3 refills | Status: DC

## 2020-10-18 NOTE — Patient Instructions (Addendum)
Medication Instructions:  Start Crestor 10mg  (1 Tablet in the Evening) *If you need a refill on your cardiac medications before your next appointment, please call your pharmacy*   Lab Work: No Labs If you have labs (blood work) drawn today and your tests are completely normal, you will receive your results only by: MyChart Message (if you have MyChart) OR . A paper copy in the mail If you have any lab test that is abnormal or we need to change your treatment, we will call you to review the results.   Testing/Procedures: No Testing    Follow-Up: At [facility], you and your health needs are our priority.  As part of our continuing mission to provide you with exceptional heart care, we have created designated Provider Care Teams.  These Care Teams include your primary Cardiologist (physician) and Advanced Practice Providers (APPs -  Physician Assistants and Nurse Practitioners) who all work together to provide you with the care you need, when you need it.    Your next appointment:   1 year(s)  The format for your next appointment:   In Person  Provider:   [facility], MD   Other Instructions Log of Blood Pressure and Heart Rate Twice Daily. In the AM after taking Synthroid. And PM 2 hours  after taking Valsartan and Crestor

## 2020-11-10 ENCOUNTER — Ambulatory Visit (INDEPENDENT_AMBULATORY_CARE_PROVIDER_SITE_OTHER): Payer: Medicare Other | Admitting: Physician Assistant

## 2020-11-10 ENCOUNTER — Other Ambulatory Visit: Payer: Self-pay

## 2020-11-10 ENCOUNTER — Encounter: Payer: Self-pay | Admitting: Physician Assistant

## 2020-11-10 VITALS — BP 147/86 | Ht 62.0 in | Wt 128.0 lb

## 2020-11-10 DIAGNOSIS — Z0000 Encounter for general adult medical examination without abnormal findings: Secondary | ICD-10-CM

## 2020-11-10 NOTE — Progress Notes (Signed)
Virtual Visit via Telephone Note:  I connected with Alejandra Barradas. Alexander by telephone and verified that I am speaking with the correct person using two identifiers.    I discussed the limitations, risks, security and privacy concerns for performing an evaluation and management service by telephone and the availability of in person appointments. The staff discussed with patient that there may be a patient responsible charge related to this service. The patient expressed understanding and agreed to proceed.   Location of Patient- Home Location of Provider- Office   Subjective:   Alejandra Alexander is a 80 y.o. female who presents for Medicare Annual (Subsequent) preventive examination.  Review of Systems    General:   No F/C, wt loss Pulm:   No DIB, SOB, pleuritic chest pain Card:  No CP, palpitations Abd:  No n/v/d or pain Ext:  No inc edema from baseline    Objective:    Today's Vitals   11/10/20 1125  BP: (!) 147/86  Weight: 128 lb (58.1 kg)  Height: 5\' 2"  (1.575 m)   Body mass index is 23.41 kg/m.  No flowsheet data found.  Current Medications (verified) Outpatient Encounter Medications as of 11/10/2020  Medication Sig  . acetaminophen (TYLENOL) 500 MG tablet Take 1 tablet (500 mg total) by mouth every 6 (six) hours as needed.  . cetirizine (ZYRTEC ALLERGY) 10 MG tablet Take 1 tablet (10 mg total) by mouth daily.  . Cholecalciferol (VITAMIN D3) 25 MCG (1000 UT) CAPS Take 2 capsules by mouth 2 (two) times daily.  . fluticasone (FLONASE) 50 MCG/ACT nasal spray Place 1 spray into both nostrils daily.  01/08/2021 levothyroxine (SYNTHROID) 50 MCG tablet Take 1 tablet (50 mcg total) by mouth daily.  . rosuvastatin (CRESTOR) 10 MG tablet Take 1 tablet (10 mg total) by mouth daily.  . valsartan (DIOVAN) 160 MG tablet Take 1 tablet (160 mg total) by mouth daily. Please keep f/u appt in December   No facility-administered encounter medications on file as of 11/10/2020.    Allergies  (verified) Lisinopril   History: Past Medical History:  Diagnosis Date  . Allergy    seasonal  . Anemia   . Chickenpox   . Hypertension   . Thyroid disease    History reviewed. No pertinent surgical history. Family History  Problem Relation Age of Onset  . Heart disease Father   . Stroke Father    Social History   Socioeconomic History  . Marital status: Widowed    Spouse name: Not on file  . Number of children: Not on file  . Years of education: Not on file  . Highest education level: Not on file  Occupational History  . Not on file  Tobacco Use  . Smoking status: Never Smoker  . Smokeless tobacco: Never Used  Vaping Use  . Vaping Use: Never used  Substance and Sexual Activity  . Alcohol use: No  . Drug use: No  . Sexual activity: Not Currently  Other Topics Concern  . Not on file  Social History Narrative  . Not on file   Social Determinants of Health   Financial Resource Strain: Not on file  Food Insecurity: Not on file  Transportation Needs: Not on file  Physical Activity: Not on file  Stress: Not on file  Social Connections: Not on file    Tobacco Counseling Counseling given: Not Answered   Diabetic? No   Activities of Daily Living In your present state of health, do you have any difficulty  performing the following activities: 11/10/2020 08/10/2020  Hearing? N N  Vision? N N  Difficulty concentrating or making decisions? N N  Walking or climbing stairs? N N  Dressing or bathing? N N  Doing errands, shopping? N N  Some recent data might be hidden    Patient Care Team: Lorrene Reid, PA-C as PCP - General (Physician Assistant) Lorretta Harp, MD as PCP - Cardiology (Cardiology) [facility], Tami Lin, [place] as Physician Assistant (Physician Assistant) Renato Shin, MD as Consulting Physician (Endocrinology)  Indicate any recent Medical Services you may have received from other than [facility] providers in the past year (date may be  approximate).     Assessment:   This is a routine wellness examination for Alejandra Alexander.  Hearing/Vision screen No exam data present  Dietary issues and exercise activities discussed: -Recommend to continue with low fat diet, dance and walking routine. Increase water hydrated and reduce caffeinated beverages.  Goals    . Blood Pressure < 130/80      Depression Screen PHQ 2/9 Scores 11/10/2020 08/10/2020 05/11/2020 11/04/2019 12/30/2018 10/23/2018 07/29/2018  PHQ - 2 Score 0 0 0 0 0 0 0  PHQ- 9 Score 0 0 0 0 0 0 0  Exception Documentation - - - - - - -    Fall Risk Fall Risk  11/10/2020 08/10/2020 11/04/2019 10/23/2018 05/16/2018  Falls in the past year? 0 0 0 0 No  Follow up Falls evaluation completed Falls evaluation completed Falls evaluation completed - -    FALL RISK PREVENTION PERTAINING TO THE HOME:  Any stairs in or around the home? Yes  If so, are there any without handrails? Yes  Home free of loose throw rugs in walkways, pet beds, electrical cords, etc? Yes  Adequate lighting in your home to reduce risk of falls? Yes   ASSISTIVE DEVICES UTILIZED TO PREVENT FALLS:  Life alert? Yes  Use of a cane, walker or w/c? No  Grab bars in the bathroom? No  Shower chair or bench in shower? No  Elevated toilet seat or a handicapped toilet? No   TIMED UP AND GO:  Was the test performed? No .  Length of time to ambulate 10 feet:  sec. TELEHEALTH    Cognitive Function: wnl     6CIT Screen 11/10/2020 11/04/2019  What Year? 0 points 0 points  What month? 0 points 0 points  What time? 0 points 0 points  Count back from 20 0 points 0 points  Months in reverse 0 points 2 points  Repeat phrase 4 points 4 points  Total Score 4 6    Immunizations Immunization History  Administered Date(s) Administered  . Fluad Quad(high Dose 65+) 09/12/2019  . Influenza Whole 09/16/2007, 09/03/2009  . Influenza, High Dose Seasonal PF 10/23/2018  . Influenza,inj,Quad PF,6+ Mos 09/09/2015  . PFIZER  SARS-COV-2 Vaccination 01/04/2020, 01/28/2020  . Pneumococcal Conjugate-13 09/09/2015  . Pneumococcal Polysaccharide-23 09/06/2005    TDAP status: Due, Education has been provided regarding the importance of this vaccine. Advised may receive this vaccine at local pharmacy or Health Dept. Aware to provide a copy of the vaccination record if obtained from local pharmacy or Health Dept. Verbalized acceptance and understanding.  Flu Vaccine status: Up to date  Pneumococcal vaccine status: Up to date  Covid-19 vaccine status: Completed vaccines  Qualifies for Shingles Vaccine? Yes   Zostavax completed No   Shingrix Completed?: No.    Education has been provided regarding the importance of this vaccine. Patient has been  advised to call insurance company to determine out of pocket expense if they have not yet received this vaccine. Advised may also receive vaccine at local pharmacy or Health Dept. Verbalized acceptance and understanding.  Screening Tests Health Maintenance  Topic Date Due  . TETANUS/TDAP  Never done  . DEXA SCAN  Never done  . INFLUENZA VACCINE  06/06/2020  . COVID-19 Vaccine (3 - Booster for Pfizer series) 07/30/2020  . PNA vac Low Risk Adult  Completed    Health Maintenance  Health Maintenance Due  Topic Date Due  . TETANUS/TDAP  Never done  . DEXA SCAN  Never done  . INFLUENZA VACCINE  06/06/2020  . COVID-19 Vaccine (3 - Booster for Pfizer series) 07/30/2020    Colorectal cancer screening: No longer required.   Mammogram status: No longer required due to age.  Bone Density status: Ordered PATIENT DECLINED. Pt provided with contact info and advised to call to schedule appt.  Lung Cancer Screening: (Low Dose CT Chest recommended if Age 76-80 years, 30 pack-year currently smoking OR have quit w/in 15years.) does not qualify.   Lung Cancer Screening Referral:   Additional Screening:  Hepatitis C Screening: does qualify; Completed patient declined  Vision  Screening: Recommended annual ophthalmology exams for early detection of glaucoma and other disorders of the eye. Is the patient up to date with their annual eye exam?  Yes  Who is the provider or what is the name of the office in which the patient attends annual eye exams? [facility] [place] If pt is not established with a provider, would they like to be referred to a provider to establish care? No .   Dental Screening: Recommended annual dental exams for proper oral hygiene  Community Resource Referral / Chronic Care Management: CRR required this visit?  No   CCM required this visit?  No     Plan:  -Continue current medication regimen. -Continue to follow up with Cardiology and Endocrinology. -Recommend to continue with antihistamine and Flonase for rhinosinusitis and use Neti Pot or do nasal rinses.  -Follow up in 4 months for HTN, HLD, Vit D and BW for CMP, Vit D  I have personally reviewed and noted the following in the patient's chart:   . Medical and social history . Use of alcohol, tobacco or illicit drugs  . Current medications and supplements . Functional ability and status . Nutritional status . Physical activity . Advanced directives . List of other physicians . Hospitalizations, surgeries, and ER visits in previous 12 months . Vitals . Screenings to include cognitive, depression, and falls . Referrals and appointments  In addition, I have reviewed and discussed with patient certain preventive protocols, quality metrics, and best practice recommendations. A written personalized care plan for preventive services as well as general preventive health recommendations were provided to patient.

## 2020-12-13 ENCOUNTER — Other Ambulatory Visit: Payer: Self-pay | Admitting: Cardiovascular Disease

## 2021-03-10 ENCOUNTER — Ambulatory Visit (INDEPENDENT_AMBULATORY_CARE_PROVIDER_SITE_OTHER): Payer: Medicare Other | Admitting: Physician Assistant

## 2021-03-10 ENCOUNTER — Encounter: Payer: Self-pay | Admitting: Physician Assistant

## 2021-03-10 ENCOUNTER — Other Ambulatory Visit: Payer: Self-pay

## 2021-03-10 VITALS — BP 185/84 | HR 74 | Temp 98.2°F | Ht 62.0 in | Wt 134.0 lb

## 2021-03-10 DIAGNOSIS — J309 Allergic rhinitis, unspecified: Secondary | ICD-10-CM

## 2021-03-10 DIAGNOSIS — E782 Mixed hyperlipidemia: Secondary | ICD-10-CM

## 2021-03-10 DIAGNOSIS — I10 Essential (primary) hypertension: Secondary | ICD-10-CM | POA: Diagnosis not present

## 2021-03-10 DIAGNOSIS — H6983 Other specified disorders of Eustachian tube, bilateral: Secondary | ICD-10-CM

## 2021-03-10 DIAGNOSIS — E559 Vitamin D deficiency, unspecified: Secondary | ICD-10-CM | POA: Diagnosis not present

## 2021-03-10 MED ORDER — VALSARTAN 320 MG PO TABS: 320.0000 mg | ORAL_TABLET | Freq: Every day | ORAL | 0 refills | Status: DC

## 2021-03-10 NOTE — Progress Notes (Signed)
Established Patient Office Visit  Subjective:  Patient ID: Alejandra Alexander, female    DOB: 1941-02-06  Age: 80 y.o. MRN: [id number]  CC:  Chief Complaint  Patient presents with  . Follow-up    HTN, HLD, VITAMIN D    HPI Alejandra Alexander presents for follow up on hypertension and hyperlipidemia. Patient has c/o head and ears feeling congested and full x several weeks. Reports decreased hearing. Denies fever, runny nose, chills, sore throat, otalgia or otorrhea. States started taking Corcidin decongestant which has helped some. Has been taking Zyrtec. States has a nasal spray she hasn't used.  HTN: Pt denies chest pain, palpitations, dizziness or lower extremity swelling. Taking medication as directed without side effects. Checks BP at home  and readings range have been 145-148/70-80. Pt states she does not add salt to food and tries to cook with minimal salt, however her sister has been cooking for her the last few weeks and unsure how much sodium her sister is cooking with.   HLD: Pt taking medication as directed without issues. Denies side effects including myalgias, arthralgias, and RUQ pain. Tries to exercise (walking) about 1 hr 5 days/wk.     Past Medical History:  Diagnosis Date  . Allergy    seasonal  . Anemia   . Chickenpox   . Hypertension   . Thyroid disease     History reviewed. No pertinent surgical history.  Family History  Problem Relation Age of Onset  . Heart disease Father   . Stroke Father     Social History   Socioeconomic History  . Marital status: Widowed    Spouse name: Not on file  . Number of children: Not on file  . Years of education: Not on file  . Highest education level: Not on file  Occupational History  . Not on file  Tobacco Use  . Smoking status: Never Smoker  . Smokeless tobacco: Never Used  Vaping Use  . Vaping Use: Never used  Substance and Sexual Activity  . Alcohol use: No  . Drug use: No  . Sexual activity: Not  Currently  Other Topics Concern  . Not on file  Social History Narrative  . Not on file   Social Determinants of Health   Financial Resource Strain: Not on file  Food Insecurity: Not on file  Transportation Needs: Not on file  Physical Activity: Not on file  Stress: Not on file  Social Connections: Not on file  Intimate Partner Violence: Not on file    Outpatient Medications Prior to Visit  Medication Sig Dispense Refill  . acetaminophen (TYLENOL) 500 MG tablet Take 1 tablet (500 mg total) by mouth every 6 (six) hours as needed. 30 tablet 0  . cetirizine (ZYRTEC ALLERGY) 10 MG tablet Take 1 tablet (10 mg total) by mouth daily. 30 tablet 0  . Cholecalciferol (VITAMIN D3) 25 MCG (1000 UT) CAPS Take 2 capsules by mouth 2 (two) times daily.    . fluticasone (FLONASE) 50 MCG/ACT nasal spray Place 1 spray into both nostrils daily. 16 g 0  . levothyroxine (SYNTHROID) 50 MCG tablet Take 1 tablet (50 mcg total) by mouth daily. 90 tablet 3  . valsartan (DIOVAN) 160 MG tablet TAKE 1 TABLET BY MOUTH DAILY. PLEASE KEEP FOLLOW UP APPOINTMENT IN DECEMBER 90 tablet 3  . rosuvastatin (CRESTOR) 10 MG tablet Take 1 tablet (10 mg total) by mouth daily. 90 tablet 3   No facility-administered medications prior to visit.  Allergies  Allergen Reactions  . Lisinopril Other (See Comments)    Weakness, visual disturbances    ROS Review of Systems A fourteen system review of systems was performed and found to be positive as per HPI.   Objective:    Physical Exam General:  Well Developed, well nourished, appropriate for stated age.  Neuro:  Alert and oriented,  extra-ocular muscles intact  HEENT:  Normocephalic, atraumatic, no tenderness to frontal or maxillary sinus, MEE of both ears w/o erythema or bulging TM's, normal external ear canal of both ears, negative Pinna and Tragus signs's of both ears, nasal mucosa pink with slight clear drainage, posterior oropharynx pink without exudates,  neck  supple Skin:  no gross rash, warm, pink. Cardiac:  RRR, S1 S2 wnl's  Respiratory:  ECTA B/L w/o wheezing or crackles, Not using accessory muscles, speaking in full sentences- unlabored. Vascular:  Ext warm, no cyanosis apprec.; cap RF less 2 sec. Psych:  No HI/SI, judgement and insight good, Euthymic mood. Full Affect.  BP (!) 185/84   Pulse 74   Temp 98.2 F (36.8 C)   Ht _0  (1.575 m)   Wt 134 lb (60.8 kg)   SpO2 98%   BMI 24.51 kg/m  Wt Readings from Last 3 Encounters:  03/10/21 134 lb (60.8 kg)  11/10/20 128 lb (58.1 kg)  10/18/20 128 lb (58.1 kg)     Health Maintenance Due  Topic Date Due  . TETANUS/TDAP  Never done  . DEXA SCAN  Never done  . COVID-19 Vaccine (3 - Booster for Pfizer series) 07/30/2020    There are no preventive care reminders to display for this patient.  Lab Results  Component Value Date   TSH 2.220 08/03/2020   Lab Results  Component Value Date   WBC 5.2 08/03/2020   HGB 14.1 08/03/2020   HCT 41.9 08/03/2020   MCV 88 08/03/2020   PLT 244 08/03/2020   Lab Results  Component Value Date   NA 144 03/10/2021   K 4.4 03/10/2021   CO2 23 03/10/2021   GLUCOSE 96 03/10/2021   BUN 15 03/10/2021   CREATININE 0.90 03/10/2021   BILITOT 0.6 03/10/2021   ALKPHOS 102 03/10/2021   AST 21 03/10/2021   ALT 20 03/10/2021   PROT 6.8 03/10/2021   ALBUMIN 4.7 03/10/2021   CALCIUM 9.6 03/10/2021   EGFR 65 03/10/2021   Lab Results  Component Value Date   CHOL 185 03/10/2021   Lab Results  Component Value Date   HDL 65 03/10/2021   Lab Results  Component Value Date   LDLCALC 103 (H) 03/10/2021   Lab Results  Component Value Date   TRIG 93 03/10/2021   Lab Results  Component Value Date   CHOLHDL 2.8 03/10/2021   Lab Results  Component Value Date   HGBA1C 5.6 08/03/2020      Assessment & Plan:   Problem List Items Addressed This Visit      Cardiovascular and Mediastinum   Hypertension - Primary (Chronic)   Relevant  Medications   valsartan (DIOVAN) 320 MG tablet   Other Relevant Orders   Comprehensive metabolic panel (Completed)     Respiratory   Allergic rhinitis     Other   Hyperlipidemia   Relevant Medications   valsartan (DIOVAN) 320 MG tablet   Other Relevant Orders   Lipid panel (Completed)   Vitamin D deficiency   Relevant Orders   VITAMIN D 25 Hydroxy (Vit-D Deficiency, Fractures) (Completed)  Other Visit Diagnoses    Dysfunction of both eustachian tubes         Hypertension: -BP significantly elevated in office and ambulatory BP has been above goal so discussed with patient treatment adjustments by increasing Valsartan to 320 mg. Patient verbalized understanding and agreeable. Advised reviewed cardiology consult 10/18/2020 and increasing Valsartan was also recommended if BP remained consistently >130/80. Will collect CMP for medication monitoring. -Recommend to continue ambulatory BP monitoring. Closely monitor sodium intake. -Advised to schedule nurse visit for BP recheck in 2 weeks and repeating CMP due to med increase.  Hyperlipidemia: -Last lipid panel: total cholesterol 149, triglycerides 110, HDL 58, LDL 71 -Continue current medication regimen.  -Will repeat lipid panel and hepatic function today. -Continue walking regimen and follow a heart healthy diet.  Dysfunction of both eustachian tubes, Allergic rhinitis: -Advised to continue decongestant x couple of days, continue Zyrtec and use nasal spray (Flonase). -Follow up if symptoms fail to improve or worsen.  Vitamin D deficiency: -Last Vitamin D 20.1, will repeat Vitamin D today. -Patient on Vit D3 2000 units daily.   Meds ordered this encounter  Medications  . valsartan (DIOVAN) 320 MG tablet    Sig: Take 1 tablet (320 mg total) by mouth daily.    Dispense:  90 tablet    Refill:  0    Follow-up: Return in about 3 months (around 06/10/2021) for HTN, HLD (NV in 2 weeks for BP check).   Note:  This note was  prepared with assistance of Dragon voice recognition software. Occasional wrong-word or sound-a-like substitutions may have occurred due to the inherent limitations of voice recognition software.  Lorrene Reid, PA-C

## 2021-03-10 NOTE — Patient Instructions (Addendum)
DEBROX for ear wax.  Please check your BLOOD PRESSURE 2 times daily, Once in the morning and then again 2 hrs after taking BP Meds.   https://www.mata.com/.pdf">  DASH Eating Plan DASH stands for Dietary Approaches to Stop Hypertension. The DASH eating plan is a healthy eating plan that has been shown to:  Reduce high blood pressure (hypertension).  Reduce your risk for type 2 diabetes, heart disease, and stroke.  Help with weight loss. What are tips for following this plan? Reading food labels  Check food labels for the amount of salt (sodium) per serving. Choose foods with less than 5 percent of the Daily Value of sodium. Generally, foods with less than 300 milligrams (mg) of sodium per serving fit into this eating plan.  To find whole grains, look for the word "whole" as the first word in the ingredient list. Shopping  Buy products labeled as "low-sodium" or "no salt added."  Buy fresh foods. Avoid canned foods and pre-made or frozen meals. Cooking  Avoid adding salt when cooking. Use salt-free seasonings or herbs instead of table salt or sea salt. Check with your health care provider or pharmacist before using salt substitutes.  Do not fry foods. Cook foods using healthy methods such as baking, boiling, grilling, roasting, and broiling instead.  Cook with heart-healthy oils, such as olive, canola, avocado, soybean, or sunflower oil. Meal planning  Eat a balanced diet that includes: ? 4 or more servings of fruits and 4 or more servings of vegetables each day. Try to fill one-half of your plate with fruits and vegetables. ? 6-8 servings of whole grains each day. ? Less than 6 oz (170 g) of lean meat, poultry, or fish each day. A 3-oz (85-g) serving of meat is about the same size as a deck of cards. One egg equals 1 oz (28 g). ? 2-3 servings of low-fat dairy each day. One serving is 1 cup (237 mL). ? 1 serving of nuts, seeds, or beans 5  times each week. ? 2-3 servings of heart-healthy fats. Healthy fats called omega-3 fatty acids are found in foods such as walnuts, flaxseeds, fortified milks, and eggs. These fats are also found in cold-water fish, such as sardines, salmon, and mackerel.  Limit how much you eat of: ? Canned or prepackaged foods. ? Food that is high in trans fat, such as some fried foods. ? Food that is high in saturated fat, such as fatty meat. ? Desserts and other sweets, sugary drinks, and other foods with added sugar. ? Full-fat dairy products.  Do not salt foods before eating.  Do not eat more than 4 egg yolks a week.  Try to eat at least 2 vegetarian meals a week.  Eat more home-cooked food and less restaurant, buffet, and fast food.   Lifestyle  When eating at a restaurant, ask that your food be prepared with less salt or no salt, if possible.  If you drink alcohol: ? Limit how much you use to:  0-1 drink a day for women who are not pregnant.  0-2 drinks a day for men. ? Be aware of how much alcohol is in your drink. In the [place] one drink equals one 12 oz bottle of beer (355 mL), one 5 oz glass of wine (148 mL), or one 1 oz glass of hard liquor (44 mL). General information  Avoid eating more than 2,300 mg of salt a day. If you have hypertension, you may need to reduce your sodium intake to  1,500 mg a day.  Work with your health care provider to maintain a healthy body weight or to lose weight. Ask what an ideal weight is for you.  Get at least 30 minutes of exercise that causes your heart to beat faster (aerobic exercise) most days of the week. Activities may include walking, swimming, or biking.  Work with your health care provider or dietitian to adjust your eating plan to your individual calorie needs. What foods should I eat? Fruits All fresh, dried, or frozen fruit. Canned fruit in natural juice (without added sugar). Vegetables Fresh or frozen vegetables (raw, steamed, roasted,  or grilled). Low-sodium or reduced-sodium tomato and vegetable juice. Low-sodium or reduced-sodium tomato sauce and tomato paste. Low-sodium or reduced-sodium canned vegetables. Grains Whole-grain or whole-wheat bread. Whole-grain or whole-wheat pasta. Brown rice. Orpah Cobb. Bulgur. Whole-grain and low-sodium cereals. Pita bread. Low-fat, low-sodium crackers. Whole-wheat flour tortillas. Meats and other proteins Skinless chicken or [place]. Ground chicken or [place]. Pork with fat trimmed off. Fish and seafood. Egg whites. Dried beans, peas, or lentils. Unsalted nuts, nut butters, and seeds. Unsalted canned beans. Lean cuts of beef with fat trimmed off. Low-sodium, lean precooked or cured meat, such as sausages or meat loaves. Dairy Low-fat (1%) or fat-free (skim) milk. Reduced-fat, low-fat, or fat-free cheeses. Nonfat, low-sodium ricotta or cottage cheese. Low-fat or nonfat yogurt. Low-fat, low-sodium cheese. Fats and oils Soft margarine without trans fats. Vegetable oil. Reduced-fat, low-fat, or light mayonnaise and salad dressings (reduced-sodium). Canola, safflower, olive, avocado, soybean, and sunflower oils. Avocado. Seasonings and condiments Herbs. Spices. Seasoning mixes without salt. Other foods Unsalted popcorn and pretzels. Fat-free sweets. The items listed above may not be a complete list of foods and beverages you can eat. Contact a dietitian for more information. What foods should I avoid? Fruits Canned fruit in a light or heavy syrup. Fried fruit. Fruit in cream or butter sauce. Vegetables Creamed or fried vegetables. Vegetables in a cheese sauce. Regular canned vegetables (not low-sodium or reduced-sodium). Regular canned tomato sauce and paste (not low-sodium or reduced-sodium). Regular tomato and vegetable juice (not low-sodium or reduced-sodium). Rosita Fire. Olives. Grains Baked goods made with fat, such as croissants, muffins, or some breads. Dry pasta or rice meal  packs. Meats and other proteins Fatty cuts of meat. Ribs. Fried meat. Tomasa Blase. Bologna, salami, and other precooked or cured meats, such as sausages or meat loaves. Fat from the back of a pig (fatback). Bratwurst. Salted nuts and seeds. Canned beans with added salt. Canned or smoked fish. Whole eggs or egg yolks. Chicken or [place] with skin. Dairy Whole or 2% milk, cream, and half-and-half. Whole or full-fat cream cheese. Whole-fat or sweetened yogurt. Full-fat cheese. Nondairy creamers. Whipped toppings. Processed cheese and cheese spreads. Fats and oils Butter. Stick margarine. Lard. Shortening. Ghee. Bacon fat. Tropical oils, such as coconut, palm kernel, or palm oil. Seasonings and condiments Onion salt, garlic salt, seasoned salt, table salt, and sea salt. Worcestershire sauce. Tartar sauce. Barbecue sauce. Teriyaki sauce. Soy sauce, including reduced-sodium. Steak sauce. Canned and packaged gravies. Fish sauce. Oyster sauce. Cocktail sauce. Store-bought horseradish. Ketchup. Mustard. Meat flavorings and tenderizers. Bouillon cubes. Hot sauces. Pre-made or packaged marinades. Pre-made or packaged taco seasonings. Relishes. Regular salad dressings. Other foods Salted popcorn and pretzels. The items listed above may not be a complete list of foods and beverages you should avoid. Contact a dietitian for more information. Where to find more information  National Heart, Lung, and Blood Institute: PopSteam.is  American Heart Association: www.heart.org  Academy of Nutrition and Dietetics: www.eatright.org  National Kidney Foundation: www.kidney.org Summary  The DASH eating plan is a healthy eating plan that has been shown to reduce high blood pressure (hypertension). It may also reduce your risk for type 2 diabetes, heart disease, and stroke.  When on the DASH eating plan, aim to eat more fresh fruits and vegetables, whole grains, lean proteins, low-fat dairy, and heart-healthy  fats.  With the DASH eating plan, you should limit salt (sodium) intake to 2,300 mg a day. If you have hypertension, you may need to reduce your sodium intake to 1,500 mg a day.  Work with your health care provider or dietitian to adjust your eating plan to your individual calorie needs. This information is not intended to replace advice given to you by your health care provider. Make sure you discuss any questions you have with your health care provider. Document Revised: 09/26/2019 Document Reviewed: 09/26/2019 Elsevier Patient Education  2021 [employer].

## 2021-03-11 LAB — COMPREHENSIVE METABOLIC PANEL
ALT: 20 IU/L (ref 0–32)
AST: 21 IU/L (ref 0–40)
Albumin/Globulin Ratio: 2.2 (ref 1.2–2.2)
Albumin: 4.7 g/dL (ref 3.7–4.7)
Alkaline Phosphatase: 102 IU/L (ref 44–121)
BUN/Creatinine Ratio: 17 (ref 12–28)
BUN: 15 mg/dL (ref 8–27)
Bilirubin Total: 0.6 mg/dL (ref 0.0–1.2)
CO2: 23 mmol/L (ref 20–29)
Calcium: 9.6 mg/dL (ref 8.7–10.3)
Chloride: 107 mmol/L — ABNORMAL HIGH (ref 96–106)
Creatinine, Ser: 0.9 mg/dL (ref 0.57–1.00)
Globulin, Total: 2.1 g/dL (ref 1.5–4.5)
Glucose: 96 mg/dL (ref 65–99)
Potassium: 4.4 mmol/L (ref 3.5–5.2)
Sodium: 144 mmol/L (ref 134–144)
Total Protein: 6.8 g/dL (ref 6.0–8.5)
eGFR: 65 mL/min/{1.73_m2} (ref >59)

## 2021-03-11 LAB — LIPID PANEL
Chol/HDL Ratio: 2.8 ratio (ref 0.0–4.4)
Cholesterol, Total: 185 mg/dL (ref 100–199)
HDL: 65 mg/dL (ref >39)
LDL Chol Calc (NIH): 103 mg/dL — ABNORMAL HIGH (ref 0–99)
Triglycerides: 93 mg/dL (ref 0–149)
VLDL Cholesterol Cal: 17 mg/dL (ref 5–40)

## 2021-03-11 LAB — VITAMIN D 25 HYDROXY (VIT D DEFICIENCY, FRACTURES): Vit D, 25-Hydroxy: 21.1 ng/mL — ABNORMAL LOW (ref 30.0–100.0)

## 2021-03-14 ENCOUNTER — Telehealth: Payer: Self-pay | Admitting: Physician Assistant

## 2021-03-14 NOTE — Telephone Encounter (Signed)
Patient would like Alejandra Alexander to know she took her blood pressure at 11 am today it was 145/79. This was patient's new blood pressure system and she is using more blood pressure medicine. Patient says thank you to [facility].

## 2021-03-14 NOTE — Telephone Encounter (Signed)
Anne Ng is aware. AS, CMA

## 2021-03-16 ENCOUNTER — Telehealth: Payer: Self-pay | Admitting: Endocrinology

## 2021-03-16 ENCOUNTER — Other Ambulatory Visit: Payer: Self-pay | Admitting: Endocrinology

## 2021-03-16 ENCOUNTER — Other Ambulatory Visit: Payer: Self-pay | Admitting: *Deleted

## 2021-03-16 MED ORDER — LEVOTHYROXINE SODIUM 50 MCG PO TABS: 50.0000 ug | ORAL_TABLET | Freq: Every day | ORAL | 0 refills | Status: DC

## 2021-03-16 NOTE — Telephone Encounter (Signed)
Rx sent for a 30 day supply until she's seen in the office.

## 2021-03-16 NOTE — Telephone Encounter (Signed)
Patient called to request a refill for Levothyroxine.  Pharmacy had advised her that she had no refills left.  Has not been seen since 02/24/20 so I scheduled her for a follow up appointment Tuesday 04/05/21.  Patient requesting refill to be sent to [facility] on [facility]

## 2021-03-21 ENCOUNTER — Other Ambulatory Visit: Payer: Medicare Other

## 2021-03-21 ENCOUNTER — Other Ambulatory Visit: Payer: Self-pay

## 2021-03-21 NOTE — Patient Instructions (Signed)
Managing Your Hypertension Hypertension, also called high blood pressure, is when the force of the blood pressing against the walls of the arteries is too strong. Arteries are blood vessels that carry blood from your heart throughout your body. Hypertension forces the heart to work harder to pump blood and may cause the arteries to become narrow or stiff. Understanding blood pressure readings Your personal target blood pressure may vary depending on your medical conditions, your age, and other factors. A blood pressure reading includes a higher number over a lower number. Ideally, your blood pressure should be below 120/80. You should know that:  The first, or top, number is called the systolic pressure. It is a measure of the pressure in your arteries as your heart beats.  The second, or bottom number, is called the diastolic pressure. It is a measure of the pressure in your arteries as the heart relaxes. Blood pressure is classified into four stages. Based on your blood pressure reading, your health care provider may use the following stages to determine what type of treatment you need, if any. Systolic pressure and diastolic pressure are measured in a unit called mmHg. Normal  Systolic pressure: below 120.  Diastolic pressure: below 80. Elevated  Systolic pressure: 120-129.  Diastolic pressure: below 80. Hypertension stage 1  Systolic pressure: 130-139.  Diastolic pressure: 80-89. Hypertension stage 2  Systolic pressure: 140 or above.  Diastolic pressure: 90 or above. How can this condition affect me? Managing your hypertension is an important responsibility. Over time, hypertension can damage the arteries and decrease blood flow to important parts of the body, including the brain, heart, and kidneys. Having untreated or uncontrolled hypertension can lead to:  A heart attack.  A stroke.  A weakened blood vessel (aneurysm).  Heart failure.  Kidney damage.  Eye  damage.  Metabolic syndrome.  Memory and concentration problems.  Vascular dementia. What actions can I take to manage this condition? Hypertension can be managed by making lifestyle changes and possibly by taking medicines. Your health care provider will help you make a plan to bring your blood pressure within a normal range. Nutrition  Eat a diet that is high in fiber and potassium, and low in salt (sodium), added sugar, and fat. An example eating plan is called the Dietary Approaches to Stop Hypertension (DASH) diet. To eat this way: ? Eat plenty of fresh fruits and vegetables. Try to fill one-half of your plate at each meal with fruits and vegetables. ? Eat whole grains, such as whole-wheat pasta, brown rice, or whole-grain bread. Fill about one-fourth of your plate with whole grains. ? Eat low-fat dairy products. ? Avoid fatty cuts of meat, processed or cured meats, and poultry with skin. Fill about one-fourth of your plate with lean proteins such as fish, chicken without skin, beans, eggs, and tofu. ? Avoid pre-made and processed foods. These tend to be higher in sodium, added sugar, and fat.  Reduce your daily sodium intake. Most people with hypertension should eat less than 1,500 mg of sodium a day.   Lifestyle  Work with your health care provider to maintain a healthy body weight or to lose weight. Ask what an ideal weight is for you.  Get at least 30 minutes of exercise that causes your heart to beat faster (aerobic exercise) most days of the week. Activities may include walking, swimming, or biking.  Include exercise to strengthen your muscles (resistance exercise), such as weight lifting, as part of your weekly exercise routine. Try   to do these types of exercises for 30 minutes at least 3 days a week.  Do not use any products that contain nicotine or tobacco, such as cigarettes, e-cigarettes, and chewing tobacco. If you need help quitting, ask your health care  provider.  Control any long-term (chronic) conditions you have, such as high cholesterol or diabetes.  Identify your sources of stress and find ways to manage stress. This may include meditation, deep breathing, or making time for fun activities.   Alcohol use  Do not drink alcohol if: ? Your health care provider tells you not to drink. ? You are pregnant, may be pregnant, or are planning to become pregnant.  If you drink alcohol: ? Limit how much you use to:  0-1 drink a day for women.  0-2 drinks a day for men. ? Be aware of how much alcohol is in your drink. In the [place] one drink equals one 12 oz bottle of beer (355 mL), one 5 oz glass of wine (148 mL), or one 1 oz glass of hard liquor (44 mL). Medicines Your health care provider may prescribe medicine if lifestyle changes are not enough to get your blood pressure under control and if:  Your systolic blood pressure is 130 or higher.  Your diastolic blood pressure is 80 or higher. Take medicines only as told by your health care provider. Follow the directions carefully. Blood pressure medicines must be taken as told by your health care provider. The medicine does not work as well when you skip doses. Skipping doses also puts you at risk for problems. Monitoring Before you monitor your blood pressure:  Do not smoke, drink caffeinated beverages, or exercise within 30 minutes before taking a measurement.  Use the bathroom and empty your bladder (urinate).  Sit quietly for at least 5 minutes before taking measurements. Monitor your blood pressure at home as told by your health care provider. To do this:  Sit with your back straight and supported.  Place your feet flat on the floor. Do not cross your legs.  Support your arm on a flat surface, such as a table. Make sure your upper arm is at heart level.  Each time you measure, take two or three readings one minute apart and record the results. You may also need to have your  blood pressure checked regularly by your health care provider.   General information  Talk with your health care provider about your diet, exercise habits, and other lifestyle factors that may be contributing to hypertension.  Review all the medicines you take with your health care provider because there may be side effects or interactions.  Keep all visits as told by your health care provider. Your health care provider can help you create and adjust your plan for managing your high blood pressure. Where to find more information  National Heart, Lung, and Blood Institute: www.nhlbi.nih.gov  American Heart Association: www.heart.org Contact a health care provider if:  You think you are having a reaction to medicines you have taken.  You have repeated (recurrent) headaches.  You feel dizzy.  You have swelling in your ankles.  You have trouble with your vision. Get help right away if:  You develop a severe headache or confusion.  You have unusual weakness or numbness, or you feel faint.  You have severe pain in your chest or abdomen.  You vomit repeatedly.  You have trouble breathing. These symptoms may represent a serious problem that is an emergency. Do not wait   to see if the symptoms will go away. Get medical help right away. Call your local emergency services (911 in the [place] Do not drive yourself to the hospital. Summary  Hypertension is when the force of blood pumping through your arteries is too strong. If this condition is not controlled, it may put you at risk for serious complications.  Your personal target blood pressure may vary depending on your medical conditions, your age, and other factors. For most people, a normal blood pressure is less than 120/80.  Hypertension is managed by lifestyle changes, medicines, or both.  Lifestyle changes to help manage hypertension include losing weight, eating a healthy, low-sodium diet, exercising more, stopping smoking, and  limiting alcohol. This information is not intended to replace advice given to you by your health care provider. Make sure you discuss any questions you have with your health care provider. Document Revised: 11/28/2019 Document Reviewed: 09/23/2019 Elsevier Patient Education  2021 [employer] Inc.  

## 2021-03-29 ENCOUNTER — Other Ambulatory Visit: Payer: Self-pay | Admitting: Endocrinology

## 2021-03-29 ENCOUNTER — Other Ambulatory Visit: Payer: Self-pay | Admitting: Physician Assistant

## 2021-03-31 NOTE — Telephone Encounter (Signed)
Please forward refill request to pt's primary care provider.   

## 2021-04-05 ENCOUNTER — Encounter: Payer: Self-pay | Admitting: Endocrinology

## 2021-04-05 ENCOUNTER — Ambulatory Visit: Payer: Medicare Other | Admitting: Endocrinology

## 2021-04-05 ENCOUNTER — Other Ambulatory Visit: Payer: Self-pay

## 2021-04-05 DIAGNOSIS — E039 Hypothyroidism, unspecified: Secondary | ICD-10-CM | POA: Diagnosis not present

## 2021-04-05 LAB — TSH: TSH: 0.8 u[IU]/mL (ref 0.35–4.50)

## 2021-04-05 NOTE — Patient Instructions (Addendum)
Your blood pressure is high today.  Please see your primary care provider soon, to have it rechecked Blood tests are requested for you today.  We'll let you know about the results.  No further ultrasound is needed, unless you or I have some reason to do so.    Please come back for a follow-up appointment in 6 months.

## 2021-04-05 NOTE — Progress Notes (Signed)
Subjective:    Patient ID: Alejandra Alexander, female    DOB: 1941-08-08, 80 y.o.   MRN: [id number]  HPI pt returns for f/u of multinodular goiter (first noted in 2006; she has been followed with serial [place] since then; in 2014, she had bx: result was non-neoplasic goiter (bx in 2006 was also benign); also in 2014, she was noted to have an elevated TSH, and started taking synthroid; f/u US is 2021 was unchanged).  pt states she feels well in general.  She does not notice the goiter.  She takes synthroid as rx'ed.   Past Medical History:  Diagnosis Date  . Allergy    seasonal  . Anemia   . Chickenpox   . Hypertension   . Thyroid disease     No past surgical history on file.  Social History   Socioeconomic History  . Marital status: Widowed    Spouse name: Not on file  . Number of children: Not on file  . Years of education: Not on file  . Highest education level: Not on file  Occupational History  . Not on file  Tobacco Use  . Smoking status: Never Smoker  . Smokeless tobacco: Never Used  Vaping Use  . Vaping Use: Never used  Substance and Sexual Activity  . Alcohol use: No  . Drug use: No  . Sexual activity: Not Currently  Other Topics Concern  . Not on file  Social History Narrative  . Not on file   Social Determinants of Health   Financial Resource Strain: Not on file  Food Insecurity: Not on file  Transportation Needs: Not on file  Physical Activity: Not on file  Stress: Not on file  Social Connections: Not on file  Intimate Partner Violence: Not on file    Current Outpatient Medications on File Prior to Visit  Medication Sig Dispense Refill  . acetaminophen (TYLENOL) 500 MG tablet Take 1 tablet (500 mg total) by mouth every 6 (six) hours as needed. 30 tablet 0  . cetirizine (ZYRTEC ALLERGY) 10 MG tablet Take 1 tablet (10 mg total) by mouth daily. 30 tablet 0  . Cholecalciferol (VITAMIN D3) 25 MCG (1000 UT) CAPS Take 2 capsules by mouth 2 (two) times daily.     . fluticasone (FLONASE) 50 MCG/ACT nasal spray Place 1 spray into both nostrils daily. 16 g 0  . levothyroxine (SYNTHROID) 50 MCG tablet Take 1 tablet (50 mcg total) by mouth daily. 30 tablet 0  . valsartan (DIOVAN) 320 MG tablet Take 1 tablet (320 mg total) by mouth daily. 90 tablet 0  . rosuvastatin (CRESTOR) 10 MG tablet Take 1 tablet (10 mg total) by mouth daily. 90 tablet 3   No current facility-administered medications on file prior to visit.    Allergies  Allergen Reactions  . Lisinopril Other (See Comments)    Weakness, visual disturbances    Family History  Problem Relation Age of Onset  . Heart disease Father   . Stroke Father   . Thyroid disease Neg Hx     BP (!) 190/80 (BP Location: Right Arm, Patient Position: Sitting, Cuff Size: Normal)   Pulse 87   Ht 5\' 2"  (1.575 m)   Wt 132 lb 4.8 oz (60 kg)   SpO2 95%   BMI 24.20 kg/m    Review of Systems     Objective:   Physical Exam VITAL SIGNS:  See vs page GENERAL: no distress NECK: 2 cm thyroid nodule is palpable just  to the left of the isthmus.  No palpable lymphadenopathy at the anterior neck.        Assessment & Plan:  Hypothyroidism: well-controlled.  Please continue the same synthroid.   MNG: we discussed f/u.  Clinically stable  Patient Instructions  Your blood pressure is high today.  Please see your primary care provider soon, to have it rechecked Blood tests are requested for you today.  We'll let you know about the results.  No further ultrasound is needed, unless you or I have some reason to do so.    Please come back for a follow-up appointment in 6 months.

## 2021-04-06 ENCOUNTER — Other Ambulatory Visit: Payer: Self-pay

## 2021-04-06 ENCOUNTER — Encounter: Payer: Self-pay | Admitting: Physician Assistant

## 2021-04-06 ENCOUNTER — Ambulatory Visit (INDEPENDENT_AMBULATORY_CARE_PROVIDER_SITE_OTHER): Payer: Medicare Other | Admitting: Physician Assistant

## 2021-04-06 VITALS — BP 123/70 | HR 86 | Temp 96.6°F | Ht 62.0 in | Wt 133.9 lb

## 2021-04-06 DIAGNOSIS — I10 Essential (primary) hypertension: Secondary | ICD-10-CM | POA: Diagnosis not present

## 2021-04-06 LAB — T4, FREE: Free T4: 0.79 ng/dL (ref 0.60–1.60)

## 2021-04-06 NOTE — Patient Instructions (Signed)

## 2021-04-06 NOTE — Progress Notes (Signed)
Established Patient Office Visit  Subjective:  Patient ID: Alejandra Alexander, female    DOB: Dec 21, 1940  Age: 80 y.o. MRN: [id number]  CC:  Chief Complaint  Patient presents with  . Follow-up  . Hypertension    HPI TOCARRA GASSEN presents for follow up on hypertension. Patient's antihypertensive was adjusted and increased to 320 mg (Valsartan).  HTN: Pt denies chest pain, palpitations, dizziness, headache or lower extremity swelling. Taking medication as directed without side effects. Patient brought BP machine and readings have been elevated >140/90. BP device is about 40 mmHg systolic and 20 mmHg diastolic difference with office device. Pt states no longer eating her sister's food so has resumed low sodium diet.  Past Medical History:  Diagnosis Date  . Allergy    seasonal  . Anemia   . Chickenpox   . Hypertension   . Thyroid disease     History reviewed. No pertinent surgical history.  Family History  Problem Relation Age of Onset  . Heart disease Father   . Stroke Father   . Thyroid disease Neg Hx     Social History   Socioeconomic History  . Marital status: Widowed    Spouse name: Not on file  . Number of children: Not on file  . Years of education: Not on file  . Highest education level: Not on file  Occupational History  . Not on file  Tobacco Use  . Smoking status: Never Smoker  . Smokeless tobacco: Never Used  Vaping Use  . Vaping Use: Never used  Substance and Sexual Activity  . Alcohol use: No  . Drug use: No  . Sexual activity: Not Currently  Other Topics Concern  . Not on file  Social History Narrative  . Not on file   Social Determinants of Health   Financial Resource Strain: Not on file  Food Insecurity: Not on file  Transportation Needs: Not on file  Physical Activity: Not on file  Stress: Not on file  Social Connections: Not on file  Intimate Partner Violence: Not on file    Outpatient Medications Prior to Visit  Medication  Sig Dispense Refill  . acetaminophen (TYLENOL) 500 MG tablet Take 1 tablet (500 mg total) by mouth every 6 (six) hours as needed. 30 tablet 0  . cetirizine (ZYRTEC ALLERGY) 10 MG tablet Take 1 tablet (10 mg total) by mouth daily. 30 tablet 0  . Cholecalciferol (VITAMIN D3) 25 MCG (1000 UT) CAPS Take 2 capsules by mouth 2 (two) times daily.    . fluticasone (FLONASE) 50 MCG/ACT nasal spray Place 1 spray into both nostrils daily. 16 g 0  . levothyroxine (SYNTHROID) 50 MCG tablet Take 1 tablet (50 mcg total) by mouth daily. 30 tablet 0  . valsartan (DIOVAN) 320 MG tablet Take 1 tablet (320 mg total) by mouth daily. 90 tablet 0  . rosuvastatin (CRESTOR) 10 MG tablet Take 1 tablet (10 mg total) by mouth daily. 90 tablet 3   No facility-administered medications prior to visit.    Allergies  Allergen Reactions  . Lisinopril Other (See Comments)    Weakness, visual disturbances    ROS Review of Systems Review of Systems:  A fourteen system review of systems was performed and found to be positive as per HPI.   Objective:    Physical Exam General:  Well Developed, well nourished, appropriate for stated age.  Neuro:  Alert and oriented,  extra-ocular muscles intact  HEENT:  Normocephalic, atraumatic, neck supple  Skin:  no gross rash, warm, pink. Cardiac:  RRR, S1 S2 Respiratory:  ECTA B/L w/o wheezing, crackles or rales, Not using accessory muscles, speaking in full sentences- unlabored. Vascular:  Ext warm, no cyanosis apprec.; cap RF less 2 sec. Psych:  No HI/SI, judgement and insight good, Euthymic mood. Full Affect.   BP 123/70   Pulse 86   Temp (!) 96.6 F (35.9 C)   Ht _0  (1.575 m)   Wt 133 lb 14.4 oz (60.7 kg)   SpO2 96%   BMI 24.49 kg/m  Wt Readings from Last 3 Encounters:  04/06/21 133 lb 14.4 oz (60.7 kg)  04/05/21 132 lb 4.8 oz (60 kg)  03/21/21 136 lb (61.7 kg)     Health Maintenance Due  Topic Date Due  . TETANUS/TDAP  Never done  . Zoster Vaccines-  Shingrix (1 of 2) Never done  . DEXA SCAN  Never done  . COVID-19 Vaccine (3 - Booster for Pfizer series) 06/29/2020    There are no preventive care reminders to display for this patient.  Lab Results  Component Value Date   TSH 0.80 04/05/2021   Lab Results  Component Value Date   WBC 5.2 08/03/2020   HGB 14.1 08/03/2020   HCT 41.9 08/03/2020   MCV 88 08/03/2020   PLT 244 08/03/2020   Lab Results  Component Value Date   NA 146 (H) 04/06/2021   K 4.1 04/06/2021   CO2 22 04/06/2021   GLUCOSE 130 (H) 04/06/2021   BUN 19 04/06/2021   CREATININE 0.87 04/06/2021   BILITOT 0.5 04/06/2021   ALKPHOS 90 04/06/2021   AST 16 04/06/2021   ALT 12 04/06/2021   PROT 6.6 04/06/2021   ALBUMIN 4.7 04/06/2021   CALCIUM 9.2 04/06/2021   EGFR 67 04/06/2021   Lab Results  Component Value Date   CHOL 185 03/10/2021   Lab Results  Component Value Date   HDL 65 03/10/2021   Lab Results  Component Value Date   LDLCALC 103 (H) 03/10/2021   Lab Results  Component Value Date   TRIG 93 03/10/2021   Lab Results  Component Value Date   CHOLHDL 2.8 03/10/2021   Lab Results  Component Value Date   HGBA1C 5.6 08/03/2020      Assessment & Plan:   Problem List Items Addressed This Visit      Cardiovascular and Mediastinum   Hypertension - Primary (Chronic)   Relevant Orders   Comp Met (CMET) (Completed)     Primary hypertension: -BP today is at goal. Ambulatory BP readings elevated likely related to malfunctioning BP device. Patient reports will purchase a new one.  -Recommend to continue with Valsartan 320 mg. Will collect CMP for medication monitoring.  -Continue low sodium diet.  -Will continue to monitor.  No orders of the defined types were placed in this encounter.   Follow-up: Return in about 3 months (around 07/07/2021) for HTN, HLD.   Note:  This note was prepared with assistance of Dragon voice recognition software. Occasional wrong-word or sound-a-like  substitutions may have occurred due to the inherent limitations of voice recognition software.  Lorrene Reid, PA-C

## 2021-04-07 LAB — COMPREHENSIVE METABOLIC PANEL
ALT: 12 IU/L (ref 0–32)
AST: 16 IU/L (ref 0–40)
Albumin/Globulin Ratio: 2.5 — ABNORMAL HIGH (ref 1.2–2.2)
Albumin: 4.7 g/dL (ref 3.7–4.7)
Alkaline Phosphatase: 90 IU/L (ref 44–121)
BUN/Creatinine Ratio: 22 (ref 12–28)
BUN: 19 mg/dL (ref 8–27)
Bilirubin Total: 0.5 mg/dL (ref 0.0–1.2)
CO2: 22 mmol/L (ref 20–29)
Calcium: 9.2 mg/dL (ref 8.7–10.3)
Chloride: 110 mmol/L — ABNORMAL HIGH (ref 96–106)
Creatinine, Ser: 0.87 mg/dL (ref 0.57–1.00)
Globulin, Total: 1.9 g/dL (ref 1.5–4.5)
Glucose: 130 mg/dL — ABNORMAL HIGH (ref 65–99)
Potassium: 4.1 mmol/L (ref 3.5–5.2)
Sodium: 146 mmol/L — ABNORMAL HIGH (ref 134–144)
Total Protein: 6.6 g/dL (ref 6.0–8.5)
eGFR: 67 mL/min/{1.73_m2} (ref >59)

## 2021-04-18 ENCOUNTER — Other Ambulatory Visit: Payer: Self-pay | Admitting: Endocrinology

## 2021-06-06 ENCOUNTER — Other Ambulatory Visit: Payer: Self-pay | Admitting: Physician Assistant

## 2021-06-06 DIAGNOSIS — I10 Essential (primary) hypertension: Secondary | ICD-10-CM

## 2021-06-13 ENCOUNTER — Other Ambulatory Visit: Payer: Self-pay

## 2021-06-13 ENCOUNTER — Encounter: Payer: Self-pay | Admitting: Nurse Practitioner

## 2021-06-13 ENCOUNTER — Ambulatory Visit (INDEPENDENT_AMBULATORY_CARE_PROVIDER_SITE_OTHER): Payer: Medicare Other | Admitting: Nurse Practitioner

## 2021-06-13 VITALS — BP 165/69 | HR 84 | Temp 97.4°F | Ht 62.0 in | Wt 135.1 lb

## 2021-06-13 DIAGNOSIS — H6123 Impacted cerumen, bilateral: Secondary | ICD-10-CM | POA: Insufficient documentation

## 2021-06-13 DIAGNOSIS — J302 Other seasonal allergic rhinitis: Secondary | ICD-10-CM

## 2021-06-13 DIAGNOSIS — I10 Essential (primary) hypertension: Secondary | ICD-10-CM | POA: Diagnosis not present

## 2021-06-13 MED ORDER — DESLORATADINE 5 MG PO TBDP: 5.0000 mg | ORAL_TABLET | Freq: Every day | ORAL | 3 refills | Status: DC

## 2021-06-13 NOTE — Progress Notes (Signed)
Acute Office Visit  Subjective:    Patient ID: Alejandra Alexander, female    DOB: 27-Mar-1941, 80 y.o.   MRN: [id number]  Chief Complaint  Patient presents with   Ear Fullness    HPI Patient is in today for evaluation of ear fullness.  States that she feels that her ears are stopped up.  Feels worse on the right side than the left.  She states the ears sound congested.  States is difficult to hear out of both ears, but worse on the right side.  Denies head congestion, headache, nasal congestion, sinus pain, or sore throat.  She denies fever.  Did take test for COVID-19 which was negative.  Spoke to the pharmacist.  Was given trial of cetirizine 10 mg tablets.  She states they work some.  Help her ears to feel less congested.  It only worked for a short period of time.  Past Medical History:  Diagnosis Date   Allergy    seasonal   Anemia    Chickenpox    Hypertension    Thyroid disease     History reviewed. No pertinent surgical history.  Family History  Problem Relation Age of Onset   Heart disease Father    Stroke Father    Thyroid disease Neg Hx     Social History   Socioeconomic History   Marital status: Widowed    Spouse name: Not on file   Number of children: Not on file   Years of education: Not on file   Highest education level: Not on file  Occupational History   Not on file  Tobacco Use   Smoking status: Never   Smokeless tobacco: Never  Vaping Use   Vaping Use: Never used  Substance and Sexual Activity   Alcohol use: No   Drug use: No   Sexual activity: Not Currently  Other Topics Concern   Not on file  Social History Narrative   Not on file   Social Determinants of Health   Financial Resource Strain: Not on file  Food Insecurity: Not on file  Transportation Needs: Not on file  Physical Activity: Not on file  Stress: Not on file  Social Connections: Not on file  Intimate Partner Violence: Not on file    Outpatient Medications Prior to Visit   Medication Sig Dispense Refill   acetaminophen (TYLENOL) 500 MG tablet Take 1 tablet (500 mg total) by mouth every 6 (six) hours as needed. 30 tablet 0   Cholecalciferol (VITAMIN D3) 25 MCG (1000 UT) CAPS Take 2 capsules by mouth 2 (two) times daily.     fluticasone (FLONASE) 50 MCG/ACT nasal spray Place 1 spray into both nostrils daily. 16 g 0   levothyroxine (SYNTHROID) 50 MCG tablet TAKE 1 TABLET (50 MCG TOTAL) BY MOUTH DAILY. 30 tablet 3   rosuvastatin (CRESTOR) 10 MG tablet Take 1 tablet (10 mg total) by mouth daily. 90 tablet 3   valsartan (DIOVAN) 320 MG tablet TAKE 1 TABLET (320 MG TOTAL) BY MOUTH DAILY. 90 tablet 0   cetirizine (ZYRTEC ALLERGY) 10 MG tablet Take 1 tablet (10 mg total) by mouth daily. 30 tablet 0   No facility-administered medications prior to visit.    Allergies  Allergen Reactions   Lisinopril Other (See Comments)    Weakness, visual disturbances    Review of Systems  Constitutional:  Negative for activity change, chills and fatigue.  HENT:  Negative for congestion, ear discharge, ear pain, postnasal drip, rhinorrhea, sinus  pressure, sinus pain and tinnitus.        Feeling fullness in both ears.  Having difficulty hearing, especially of the right ear.  Denies ear pain.  Eyes: Negative.   Respiratory:  Negative for cough, chest tightness and wheezing.   Cardiovascular:  Negative for chest pain and palpitations.       Blood pressure severely elevated at arrival.  Did come down some during the visit, though still elevated.  Patient states blood pressure is always elevated when she is here.  Gastrointestinal:  Negative for constipation.  Genitourinary: Negative.   Musculoskeletal:  Negative for back pain and myalgias.  Skin:  Negative for rash.  Allergic/Immunologic: Positive for environmental allergies.  Neurological:  Negative for dizziness, weakness and headaches.  Hematological:  Negative for adenopathy.  Psychiatric/Behavioral:  The patient is not  nervous/anxious.       Objective:   Physical Exam Vitals and nursing note reviewed.  Constitutional:      Appearance: Normal appearance.  HENT:     Head: Normocephalic and atraumatic.     Right Ear: Tympanic membrane is bulging.     Left Ear: Tympanic membrane is bulging.     Ears:     Comments: There is moderate amount of cerumen in bilateral ear canals.  Right ear canal containing more wax than left.  Patient having difficulty hearing out of both ears.  Bilateral ear lavage performed in the office today.  Moderate amount of wax removed from right ear canal.  Small amount of pain from left ear canal.  Patient tolerated the procedure well.  Reported increased ability to hear of both ears.  There is no erythema or evidence of inflammation post lavage.    Nose: No congestion.     Right Sinus: No maxillary sinus tenderness or frontal sinus tenderness.     Left Sinus: No maxillary sinus tenderness or frontal sinus tenderness.     Mouth/Throat:     Pharynx: Oropharynx is clear. Uvula midline.  Cardiovascular:     Rate and Rhythm: Normal rate and regular rhythm.     Pulses: Normal pulses.     Heart sounds: Normal heart sounds.  Pulmonary:     Effort: Pulmonary effort is normal.     Breath sounds: Normal breath sounds.  Abdominal:     Palpations: Abdomen is soft.  Musculoskeletal:        General: Normal range of motion.  Lymphadenopathy:     Cervical: No cervical adenopathy.  Skin:    General: Skin is warm and dry.     Capillary Refill: Capillary refill takes less than 2 seconds.  Neurological:     General: No focal deficit present.     Mental Status: She is alert and oriented to person, place, and time.  Psychiatric:        Mood and Affect: Mood normal.        Behavior: Behavior normal.        Thought Content: Thought content normal.        Judgment: Judgment normal.    Today's Vitals   06/13/21 1522 06/13/21 1701  BP: (!) 190/65 (!) 165/69  Pulse: 84   Temp: (!) 97.4 F  (36.3 C)   SpO2: 98%   Weight: 135 lb 1.6 oz (61.3 kg)   Height: _0  (1.575 m)    Body mass index is 24.71 kg/m.  Wt Readings from Last 3 Encounters:  06/13/21 135 lb 1.6 oz (61.3 kg)  04/06/21 133 lb 14.4  oz (60.7 kg)  04/05/21 132 lb 4.8 oz (60 kg)    Health Maintenance Due  Topic Date Due   TETANUS/TDAP  Never done   Zoster Vaccines- Shingrix (1 of 2) Never done   DEXA SCAN  Never done   COVID-19 Vaccine (3 - Booster for Pfizer series) 06/29/2020   INFLUENZA VACCINE  06/06/2021    There are no preventive care reminders to display for this patient.   Lab Results  Component Value Date   TSH 0.80 04/05/2021   Lab Results  Component Value Date   WBC 5.2 08/03/2020   HGB 14.1 08/03/2020   HCT 41.9 08/03/2020   MCV 88 08/03/2020   PLT 244 08/03/2020   Lab Results  Component Value Date   NA 146 (H) 04/06/2021   K 4.1 04/06/2021   CO2 22 04/06/2021   GLUCOSE 130 (H) 04/06/2021   BUN 19 04/06/2021   CREATININE 0.87 04/06/2021   BILITOT 0.5 04/06/2021   ALKPHOS 90 04/06/2021   AST 16 04/06/2021   ALT 12 04/06/2021   PROT 6.6 04/06/2021   ALBUMIN 4.7 04/06/2021   CALCIUM 9.2 04/06/2021   EGFR 67 04/06/2021   Lab Results  Component Value Date   CHOL 185 03/10/2021   Lab Results  Component Value Date   HDL 65 03/10/2021   Lab Results  Component Value Date   LDLCALC 103 (H) 03/10/2021   Lab Results  Component Value Date   TRIG 93 03/10/2021   Lab Results  Component Value Date   CHOLHDL 2.8 03/10/2021   Lab Results  Component Value Date   HGBA1C 5.6 08/03/2020       Assessment & Plan:  1. Bilateral impacted cerumen Bilateral ear lavage performed in the office today.  There was more wax removed from the right earlobe than left.  Patient reporting improved ability to hear on both sides.  Instructions provided for her to continue earwax removal at home.  Recommend she use 4 drops of hydrogen peroxide into the affected ear daily for 4 days.  On  fifth day advised she use small bulb syringe to gently rinse out the ear canal.  She should not drive with a small towel.  She should notify the office.  Earwax and difficulty hearing continue to be a problem for next few weeks.  2. Chronic seasonal allergic rhinitis Trial of Clarinex allergy medication 5 mg tablets daily.  We will reassess at next visit. - desloratadine (CLARINEX REDITAB) 5 MG disintegrating tablet; Take 1 tablet (5 mg total) by mouth daily.  Dispense: 30 tablet; Refill: 3  3. Primary hypertension Blood pressure very high at arrival.  Did come down some during visit but still elevated.  Patient states her blood pressure is always elevated when she first concerned.  Will reevaluate this at her next visit.  Problem List Items Addressed This Visit       Cardiovascular and Mediastinum   Primary hypertension     Respiratory   Chronic seasonal allergic rhinitis   Relevant Medications   desloratadine (CLARINEX REDITAB) 5 MG disintegrating tablet     Nervous and Auditory   Bilateral impacted cerumen - Primary     Meds ordered this encounter  Medications   desloratadine (CLARINEX REDITAB) 5 MG disintegrating tablet    Sig: Take 1 tablet (5 mg total) by mouth daily.    Dispense:  30 tablet    Refill:  3    Claritin/zyrtec not very effective for her  Order Specific Question:   Supervising Provider    Answer:   Beatrice Lecher D [[id number]   This note was dictated using Dragon Voice Recognition Software. Rapid proofreading was performed to expedite the delivery of the information. Despite proofreading, phonetic errors will occur which are common with this voice recognition software. Please take this into consideration. If there are any concerns, please contact our office.    Ronnell Freshwater, NP

## 2021-06-13 NOTE — Patient Instructions (Signed)
Nonallergic Rhinitis °Nonallergic rhinitis is inflammation of the mucous membrane inside the nose. The mucous membrane is the tissue that produces mucus. This condition is different from having allergic rhinitis, which is an allergy that affects the nose. Allergic rhinitis occurs when the body's defense system, or immune system, reacts to a substance that a person is allergic to (allergen), such as pollen, pet dander, mold, or dust. Nonallergic rhinitis has many similar symptoms, but it is not caused by allergens. °Nonallergic rhinitis can be an acute or chronic problem. This means it can be short-term or long-term. °What are the causes? °This condition may be caused by many different things. Some common types of nonallergic rhinitis include: °Infectious rhinitis. This is usually caused by an infection in the nose, throat, or upper airways (upper respiratory system). °Vasomotor rhinitis. This is the most common type of chronic nonallergic rhinitis. It is caused by too much blood flow through your nose, and it leads to swelling in your nose. It is triggered by strong odors, cold air, stress, drinking alcohol, cigarette smoke, or changes in the weather. °Occupational rhinitis. This type is caused by triggers in the workplace, such as chemicals, dust, animal dander, or air pollution. °Hormonal rhinitis, in teenage girls and women. This type is caused by an increase in the hormone estrogen and may happen during pregnancy, puberty, or monthly menstrual periods. Hormonal rhinitis gives you fewer symptoms when estrogen levels drop. °Drug-induced rhinitis. Several types of medicines can cause this, such as medicines for high blood pressure or heart disease, aspirin, or NSAIDs. °Nonallergic rhinitis with eosinophilia syndrome (NARES). This type is caused by having too much eosinophil, a type of white blood cell. °Other causes include a reaction to eating hot or spicy foods. This does not usually cause long-term symptoms. °In  some cases, the cause of nonallergic rhinitis is not known. °What increases the risk? °You are more likely to develop this condition if: °You are 30-60 years of age. °You are a woman. Women are twice as likely to have this condition. °What are the signs or symptoms? °Common symptoms of this condition include: °Stuffy nose (nasal congestion). °Runny nose. °A feeling of mucus dripping down the back of your throat (postnasal drip). °Trouble sleeping. °Tiredness, or fatigue. °Other symptoms include: °Sneezing. °Coughing. °Itchy nose. °Bloodshot eyes. °How is this diagnosed? °This type may be diagnosed based on: °Your symptoms and medical history. °A physical exam. °Allergy testing to rule out allergic rhinitis. You may have skin tests or blood tests. °Your health care provider may also take a swab of nasal discharge to look for an increased number of eosinophils. This would be done to confirm a diagnosis of NARES. °How is this treated? °Treatment for this condition depends on the cause. No single treatment works for everyone. Work with your health care provider to find the best treatment for you. Treatment may include: °Avoiding the things that trigger your symptoms. °Medicines to relieve congestion, such as: °Steroid nasal spray. There are many types. You may need to try a few to find out which one works best. °Decongestant medicine. This treats nasal congestion and may be given by mouth or as a nasal spray. These medicines are used only for a short time. °Medicines to relieve a runny nose. These may include antihistamine medicines or anticholinergic nasal sprays. °Nasal irrigation. This involves using a salt-water (saline) spray or saline container called a neti pot. Nasal irrigation helps to clear away mucus and keep your nasal passages moist. °Surgery to remove part   of your mucous membrane. This is done in severe cases if the condition has not improved after 6-12 months of treatment. °Follow these instructions at  home: °Medicines °Take or use over-the-counter and prescription medicines only as told by your health care provider. Do not stop using your medicine even if you start to feel better. °Do not take NSAIDs, such as ibuprofen, or medicines that contain aspirin if they make your symptoms worse. °Lifestyle °Do not drink alcohol if it makes your symptoms worse. °Do not use any products that contain nicotine or tobacco, such as cigarettes, e-cigarettes, and chewing tobacco. If you need help quitting, ask your health care provider. °Avoid secondhand smoke. °General instructions °Avoid triggers that make your symptoms worse. °Use nasal irrigation as told by your health care provider. °Get exercise. Exercise may help reduce symptoms for some people. °Sleep with the head of your bed raised. This may reduce nasal congestion when you sleep. °Drink enough fluid to keep your urine pale yellow. °Keep all follow-up visits as told by your health care provider. This is important. °Contact a health care provider if: °You have a fever. °Your symptoms are getting worse at home. °Your symptoms do not lessen with medicine. °You develop new symptoms, especially a headache or nosebleed. °Summary °Nonallergic rhinitis is inflammation inside the nose that is not caused by allergens. Nonallergic rhinitis can be a short-term or long-term problem. °Treatment may include avoiding the things that trigger your symptoms. °Take or use over-the-counter and prescription medicines only as told by your health care provider. Do not stop using your medicine even if you start to feel better. °Contact a health care provider if your symptoms do not lessen with medicine. °This information is not intended to replace advice given to you by your health care provider. Make sure you discuss any questions you have with your health care provider. °Document Revised: 09/01/2019 Document Reviewed: 09/01/2019 °Elsevier Patient Education © 2022 [employer] ° °

## 2021-07-18 ENCOUNTER — Encounter: Payer: Self-pay | Admitting: Physician Assistant

## 2021-07-18 ENCOUNTER — Ambulatory Visit (INDEPENDENT_AMBULATORY_CARE_PROVIDER_SITE_OTHER): Payer: Medicare Other | Admitting: Physician Assistant

## 2021-07-18 ENCOUNTER — Other Ambulatory Visit: Payer: Self-pay

## 2021-07-18 VITALS — BP 140/82 | HR 74 | Temp 98.3°F | Ht 62.0 in | Wt 132.5 lb

## 2021-07-18 DIAGNOSIS — E782 Mixed hyperlipidemia: Secondary | ICD-10-CM

## 2021-07-18 DIAGNOSIS — I10 Essential (primary) hypertension: Secondary | ICD-10-CM | POA: Diagnosis not present

## 2021-07-18 DIAGNOSIS — E559 Vitamin D deficiency, unspecified: Secondary | ICD-10-CM

## 2021-07-18 DIAGNOSIS — J302 Other seasonal allergic rhinitis: Secondary | ICD-10-CM

## 2021-07-18 NOTE — Assessment & Plan Note (Signed)
-  Last Vitamin D 21.1, will repeat Vitamin D today. Patient completed once a week Vitamin D therapy and reports non-compliance with once daily Vitamin D3.

## 2021-07-18 NOTE — Patient Instructions (Signed)

## 2021-07-18 NOTE — Progress Notes (Signed)
Established Patient Office Visit  Subjective:  Patient ID: Alejandra Alexander, female    DOB: [birth year]  Age: 80 y.o. MRN: [id number]  CC:  Chief Complaint  Patient presents with   Follow-up   Hypertension   Hyperlipidemia    HPI Alejandra Alexander presents for follow up on hypertension and hyperlipidemia. Patient reports has been taking allergy medication due to nasal congestion but still has some congestion. States there is Holiday representative going on where she lives and the dust triggers her symptoms. Denies fever, otalgia, body aches, n/v or otorrhea.  HTN: Pt denies chest pain, palpitations, dizziness or leg swelling. Taking medication as directed without side effects. Checks BP at home and readings range <135/90. Pt follows a low salt diet. Walking 35-45 minutes 5 times/wk. Drinks about 2 cups of water per day, coffee and green tea.  HLD: Pt taking medication as directed without issues. Continues with limiting red meat and fried foods.   Vitamin D deficiency: Reports forgets to take Vitamin D supplement.   Past Medical History:  Diagnosis Date   Allergy    seasonal   Anemia    Chickenpox    Hypertension    Thyroid disease     History reviewed. No pertinent surgical history.  Family History  Problem Relation Age of Onset   Heart disease Father    Stroke Father    Thyroid disease Neg Hx     Social History   Socioeconomic History   Marital status: Widowed    Spouse name: Not on file   Number of children: Not on file   Years of education: Not on file   Highest education level: Not on file  Occupational History   Not on file  Tobacco Use   Smoking status: Never   Smokeless tobacco: Never  Vaping Use   Vaping Use: Never used  Substance and Sexual Activity   Alcohol use: No   Drug use: No   Sexual activity: Not Currently  Other Topics Concern   Not on file  Social History Narrative   Not on file   Social Determinants of Health   Financial Resource Strain:  Not on file  Food Insecurity: Not on file  Transportation Needs: Not on file  Physical Activity: Not on file  Stress: Not on file  Social Connections: Not on file  Intimate Partner Violence: Not on file    Outpatient Medications Prior to Visit  Medication Sig Dispense Refill   acetaminophen (TYLENOL) 500 MG tablet Take 1 tablet (500 mg total) by mouth every 6 (six) hours as needed. 30 tablet 0   Cholecalciferol (VITAMIN D3) 25 MCG (1000 UT) CAPS Take 2 capsules by mouth 2 (two) times daily.     desloratadine (CLARINEX REDITAB) 5 MG disintegrating tablet Take 1 tablet (5 mg total) by mouth daily. 30 tablet 3   fluticasone (FLONASE) 50 MCG/ACT nasal spray Place 1 spray into both nostrils daily. 16 g 0   levothyroxine (SYNTHROID) 50 MCG tablet TAKE 1 TABLET (50 MCG TOTAL) BY MOUTH DAILY. 30 tablet 3   valsartan (DIOVAN) 320 MG tablet TAKE 1 TABLET (320 MG TOTAL) BY MOUTH DAILY. 90 tablet 0   rosuvastatin (CRESTOR) 10 MG tablet Take 1 tablet (10 mg total) by mouth daily. 90 tablet 3   No facility-administered medications prior to visit.    Allergies  Allergen Reactions   Lisinopril Other (See Comments)    Weakness, visual disturbances    ROS Review of Systems A fourteen system  review of systems was performed and found to be positive as per HPI.  Objective:    Physical Exam General:  Well Developed, well nourished, appropriate for stated age.  Neuro:  Alert and oriented,  extra-ocular muscles intact  HEENT:  Normocephalic, atraumatic, no sinus tenderness, pale turbinates, some cerumen of right ear, some fluid behind left TM, neg Tragus and Pinna sign's, neck supple,  +cervical adenopathy (submandibular) Skin:  no gross rash, warm, pink. Cardiac:  RRR, S1 S2 Respiratory:  CTA B/L, Not using accessory muscles, speaking in full sentences- unlabored. Vascular:  Ext warm, no cyanosis apprec.; cap RF less 2 sec. Psych:  No HI/SI, judgement and insight good, Euthymic mood. Full  Affect.  BP 140/82   Pulse 74   Temp 98.3 F (36.8 C)   Ht $R'5\' 2"'zW$  (1.575 m)   Wt 132 lb 8 oz (60.1 kg)   SpO2 98%   BMI 24.23 kg/m  Wt Readings from Last 3 Encounters:  07/18/21 132 lb 8 oz (60.1 kg)  06/13/21 135 lb 1.6 oz (61.3 kg)  04/06/21 133 lb 14.4 oz (60.7 kg)     Health Maintenance Due  Topic Date Due   TETANUS/TDAP  Never done   Zoster Vaccines- Shingrix (1 of 2) Never done   DEXA SCAN  Never done   COVID-19 Vaccine (3 - Booster for Pfizer series) 06/29/2020   INFLUENZA VACCINE  06/06/2021    There are no preventive care reminders to display for this patient.  Lab Results  Component Value Date   TSH 0.80 04/05/2021   Lab Results  Component Value Date   WBC 5.2 08/03/2020   HGB 14.1 08/03/2020   HCT 41.9 08/03/2020   MCV 88 08/03/2020   PLT 244 08/03/2020   Lab Results  Component Value Date   NA 146 (H) 04/06/2021   K 4.1 04/06/2021   CO2 22 04/06/2021   GLUCOSE 130 (H) 04/06/2021   BUN 19 04/06/2021   CREATININE 0.87 04/06/2021   BILITOT 0.5 04/06/2021   ALKPHOS 90 04/06/2021   AST 16 04/06/2021   ALT 12 04/06/2021   PROT 6.6 04/06/2021   ALBUMIN 4.7 04/06/2021   CALCIUM 9.2 04/06/2021   EGFR 67 04/06/2021   Lab Results  Component Value Date   CHOL 185 03/10/2021   Lab Results  Component Value Date   HDL 65 03/10/2021   Lab Results  Component Value Date   LDLCALC 103 (H) 03/10/2021   Lab Results  Component Value Date   TRIG 93 03/10/2021   Lab Results  Component Value Date   CHOLHDL 2.8 03/10/2021   Lab Results  Component Value Date   HGBA1C 5.6 08/03/2020      Assessment & Plan:   Problem List Items Addressed This Visit       Cardiovascular and Mediastinum   Primary hypertension - Primary    -BP initially elevated, BP recheck improved and stable. Ambulatory BP readings at goal <140/90. -Continue current medication regimen. -Will collect CMP for medication monitoring. -Will continue to monitor.       Relevant  Orders   Comp Met (CMET)   CBC w/Diff     Respiratory   Chronic seasonal allergic rhinitis    -Recommend to start using nasal spray (Flonase) 1 spray in each nostril BID. -Continue oral antihistamine.         Other   Hyperlipidemia    -Will repeat lipid panel and hepatic function today. -Continue current medication regimen. -Discussed low fat  diet. -Continue walking regimen.      Relevant Orders   Lipid Profile   Vitamin D deficiency    -Last Vitamin D 21.1, will repeat Vitamin D today. Patient completed once a week Vitamin D therapy and reports non-compliance with once daily Vitamin D3.      Relevant Orders   Vitamin D (25 hydroxy)    No orders of the defined types were placed in this encounter.   Follow-up: Return in about 4 months (around 11/17/2021) for [facility].    Lorrene Reid, PA-C

## 2021-07-18 NOTE — Assessment & Plan Note (Addendum)
-  BP initially elevated, BP recheck improved and stable. Ambulatory BP readings at goal <140/90. -Continue current medication regimen. -Will collect CMP for medication monitoring. -Will continue to monitor.

## 2021-07-18 NOTE — Assessment & Plan Note (Signed)
-  Recommend to start using nasal spray (Flonase) 1 spray in each nostril BID. -Continue oral antihistamine.

## 2021-07-18 NOTE — Assessment & Plan Note (Signed)
-  Will repeat lipid panel and hepatic function today. -Continue current medication regimen. -Discussed low fat diet. -Continue walking regimen.

## 2021-07-19 LAB — COMPREHENSIVE METABOLIC PANEL
ALT: 15 IU/L (ref 0–32)
AST: 18 IU/L (ref 0–40)
Albumin/Globulin Ratio: 2 (ref 1.2–2.2)
Albumin: 4.6 g/dL (ref 3.7–4.7)
Alkaline Phosphatase: 93 IU/L (ref 44–121)
BUN/Creatinine Ratio: 15 (ref 12–28)
BUN: 14 mg/dL (ref 8–27)
Bilirubin Total: 0.7 mg/dL (ref 0.0–1.2)
CO2: 22 mmol/L (ref 20–29)
Calcium: 9.7 mg/dL (ref 8.7–10.3)
Chloride: 105 mmol/L (ref 96–106)
Creatinine, Ser: 0.93 mg/dL (ref 0.57–1.00)
Globulin, Total: 2.3 g/dL (ref 1.5–4.5)
Glucose: 96 mg/dL (ref 65–99)
Potassium: 4.5 mmol/L (ref 3.5–5.2)
Sodium: 142 mmol/L (ref 134–144)
Total Protein: 6.9 g/dL (ref 6.0–8.5)
eGFR: 62 mL/min/{1.73_m2} (ref >59)

## 2021-07-19 LAB — CBC WITH DIFFERENTIAL/PLATELET
Basophils Absolute: 0 10*3/uL (ref 0.0–0.2)
Basos: 0 %
EOS (ABSOLUTE): 0.3 10*3/uL (ref 0.0–0.4)
Eos: 4 %
Hematocrit: 41.3 % (ref 34.0–46.6)
Hemoglobin: 13.8 g/dL (ref 11.1–15.9)
Immature Grans (Abs): 0 10*3/uL (ref 0.0–0.1)
Immature Granulocytes: 0 %
Lymphocytes Absolute: 2.1 10*3/uL (ref 0.7–3.1)
Lymphs: 33 %
MCH: 29.1 pg (ref 26.6–33.0)
MCHC: 33.4 g/dL (ref 31.5–35.7)
MCV: 87 fL (ref 79–97)
Monocytes Absolute: 0.5 10*3/uL (ref 0.1–0.9)
Monocytes: 8 %
Neutrophils Absolute: 3.5 10*3/uL (ref 1.4–7.0)
Neutrophils: 55 %
Platelets: 259 10*3/uL (ref 150–450)
RBC: 4.75 x10E6/uL (ref 3.77–5.28)
RDW: 13 % (ref 11.7–15.4)
WBC: 6.4 10*3/uL (ref 3.4–10.8)

## 2021-07-19 LAB — LIPID PANEL
Chol/HDL Ratio: 3.1 ratio (ref 0.0–4.4)
Cholesterol, Total: 168 mg/dL (ref 100–199)
HDL: 55 mg/dL (ref >39)
LDL Chol Calc (NIH): 89 mg/dL (ref 0–99)
Triglycerides: 139 mg/dL (ref 0–149)
VLDL Cholesterol Cal: 24 mg/dL (ref 5–40)

## 2021-07-19 LAB — VITAMIN D 25 HYDROXY (VIT D DEFICIENCY, FRACTURES): Vit D, 25-Hydroxy: 34.5 ng/mL (ref 30.0–100.0)

## 2021-08-16 ENCOUNTER — Other Ambulatory Visit: Payer: Self-pay | Admitting: Endocrinology

## 2021-08-24 ENCOUNTER — Encounter: Payer: Self-pay | Admitting: Physician Assistant

## 2021-08-24 ENCOUNTER — Ambulatory Visit (INDEPENDENT_AMBULATORY_CARE_PROVIDER_SITE_OTHER): Payer: Medicare Other | Admitting: Physician Assistant

## 2021-08-24 ENCOUNTER — Other Ambulatory Visit: Payer: Self-pay

## 2021-08-24 VITALS — BP 180/81 | HR 72 | Temp 97.7°F | Ht 62.0 in | Wt 135.0 lb

## 2021-08-24 DIAGNOSIS — R0981 Nasal congestion: Secondary | ICD-10-CM

## 2021-08-24 DIAGNOSIS — J0120 Acute ethmoidal sinusitis, unspecified: Secondary | ICD-10-CM

## 2021-08-24 LAB — POCT INFLUENZA A/B
Influenza A, POC: NEGATIVE
Influenza B, POC: NEGATIVE

## 2021-08-24 IMAGING — US US THYROID
1 series · 13 of 25 positions shown · non-contrast
Comparison: 09/20/2015

CLINICAL DATA: Multinodular goiter. History of left thyroid nodule
biopsy in 3770.

EXAM:
THYROID ULTRASOUND
TECHNIQUE: Ultrasound examination of the thyroid gland and adjacent soft
tissues was performed.

[Series 1: us thyroid · 0.08mm/px · 13 of 50 slices shown]
[im 1/50]
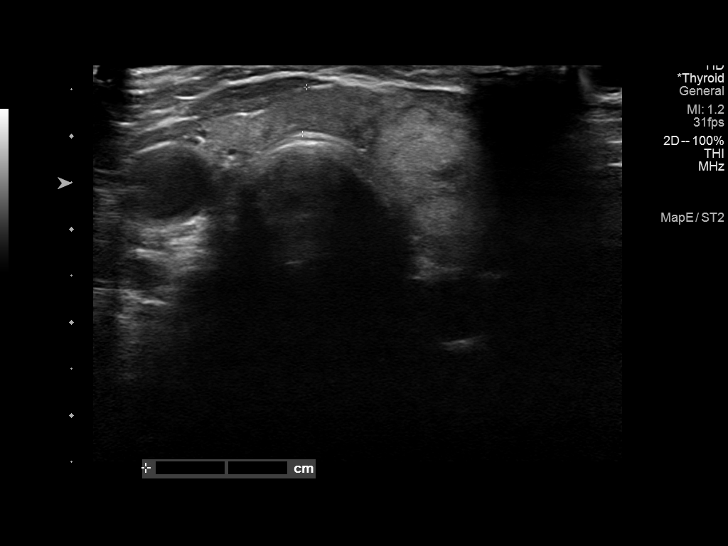
[im 5/50]
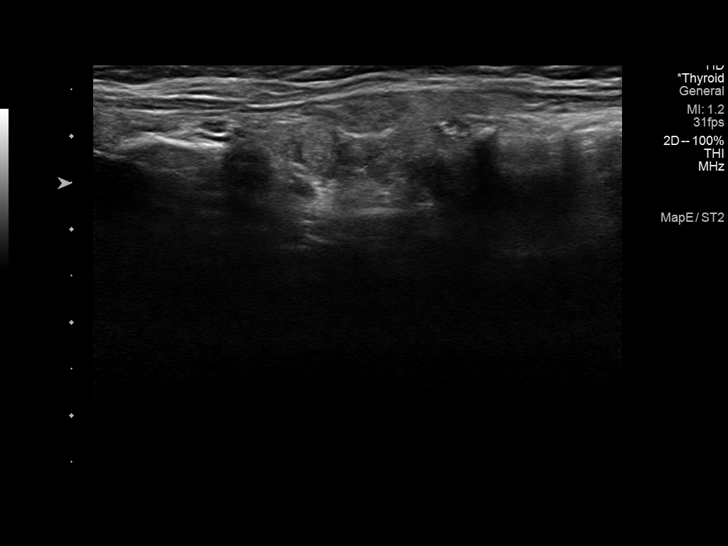
[im 9/50]
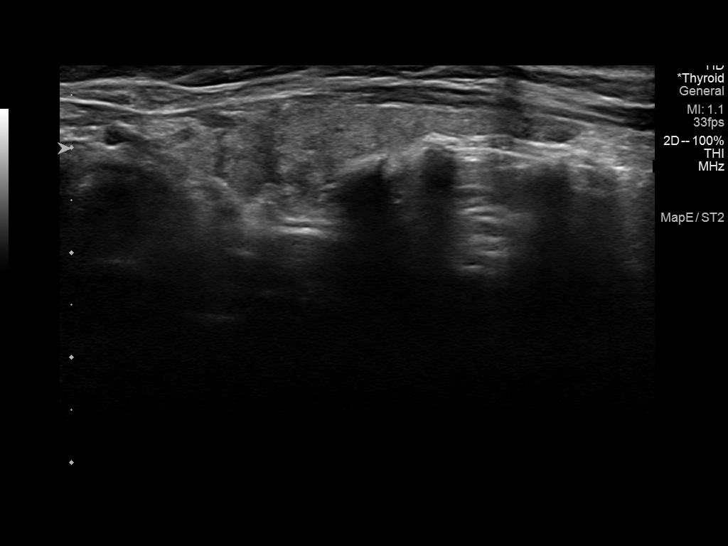
[im 13/50]
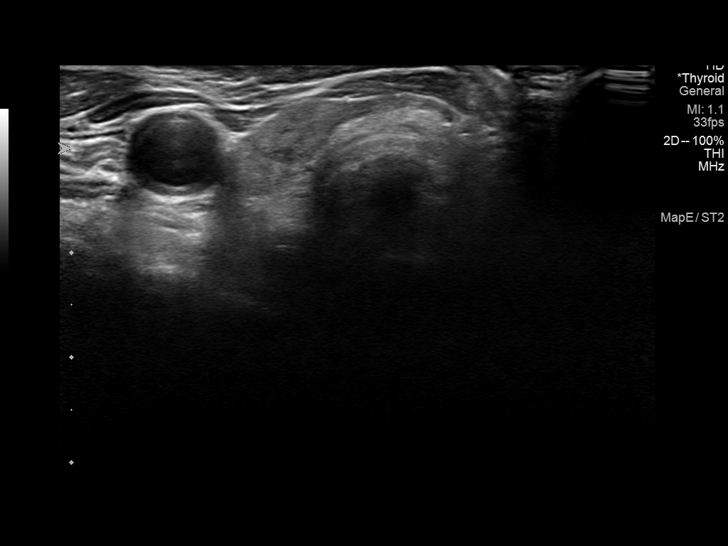
[im 17/50]
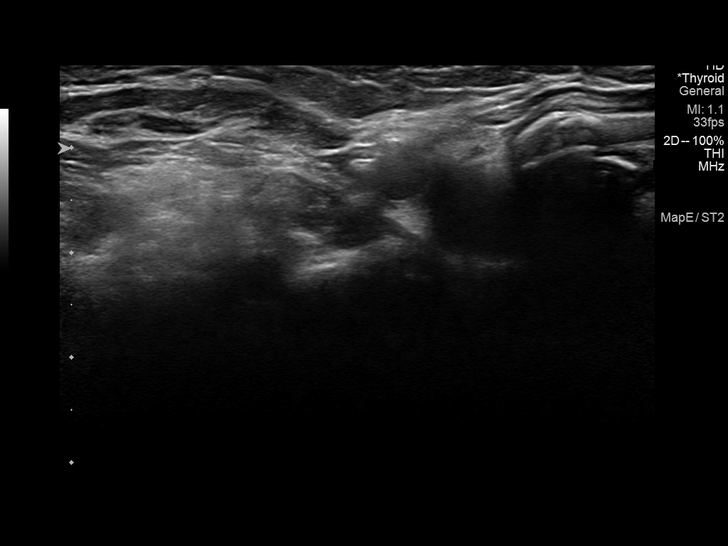
[im 21/50]
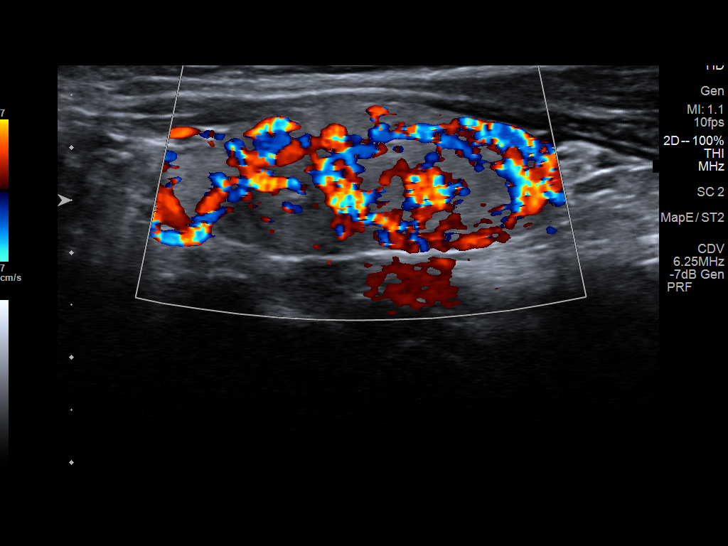
[im 25/50]
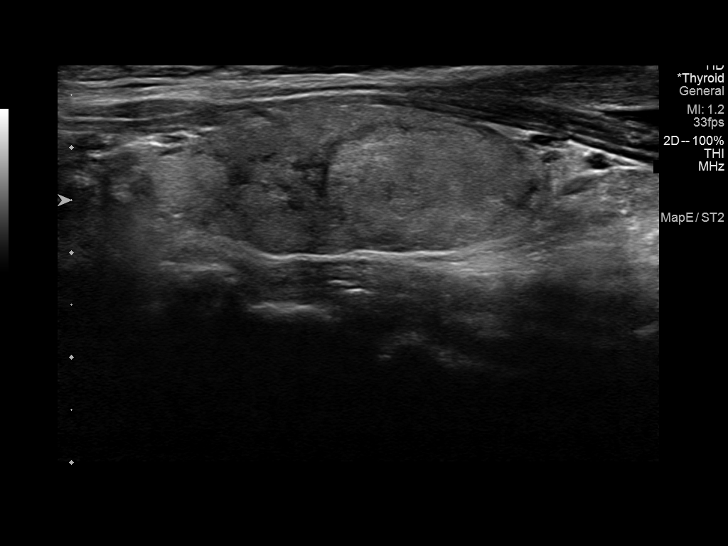
[im 29/50]
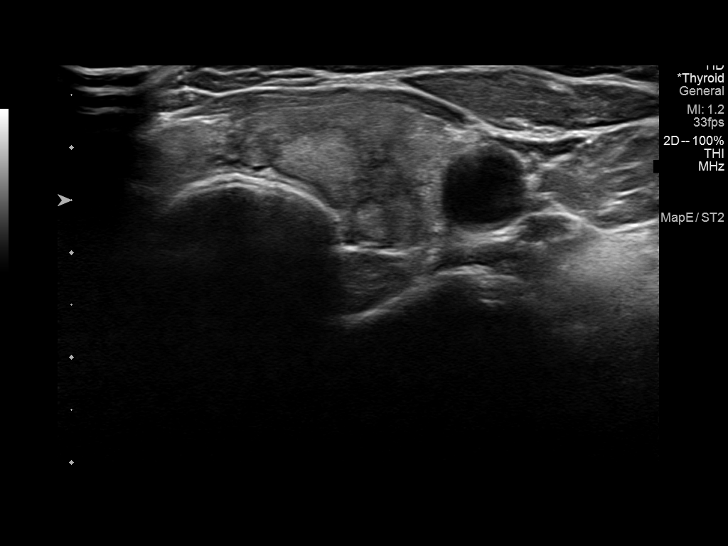
[im 33/50]
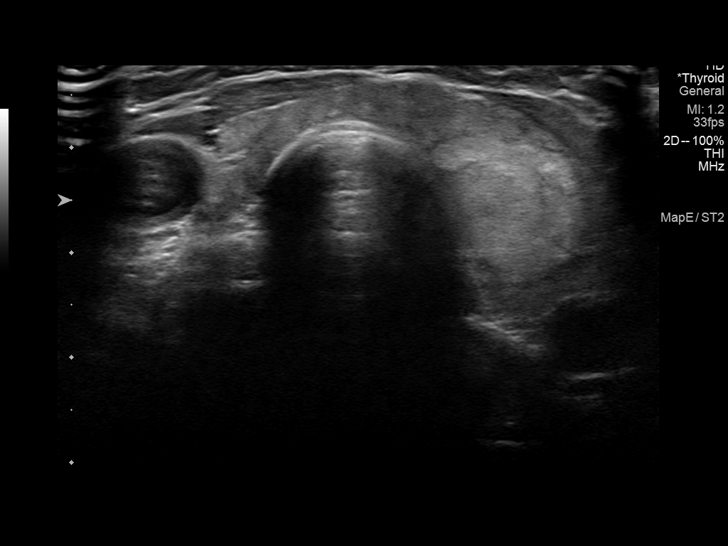
[im 37/50]
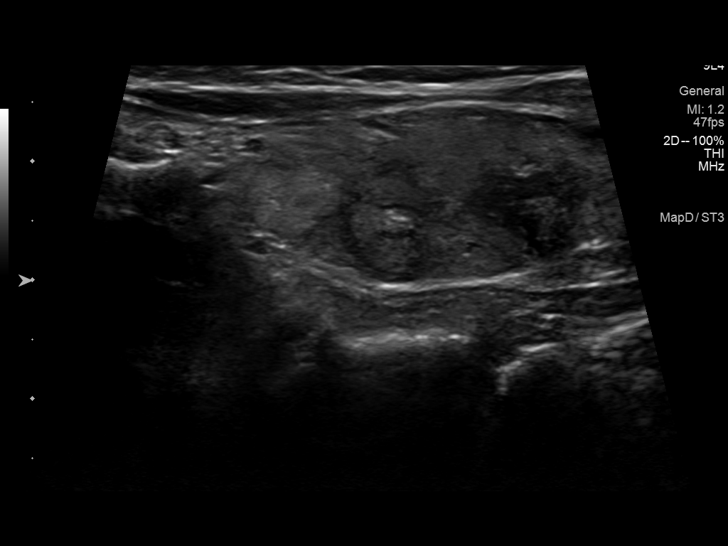
[im 41/50]
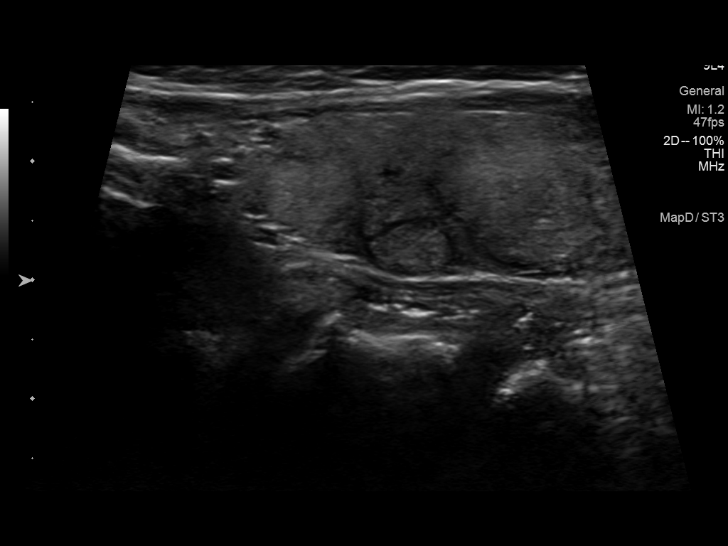
[im 45/50]
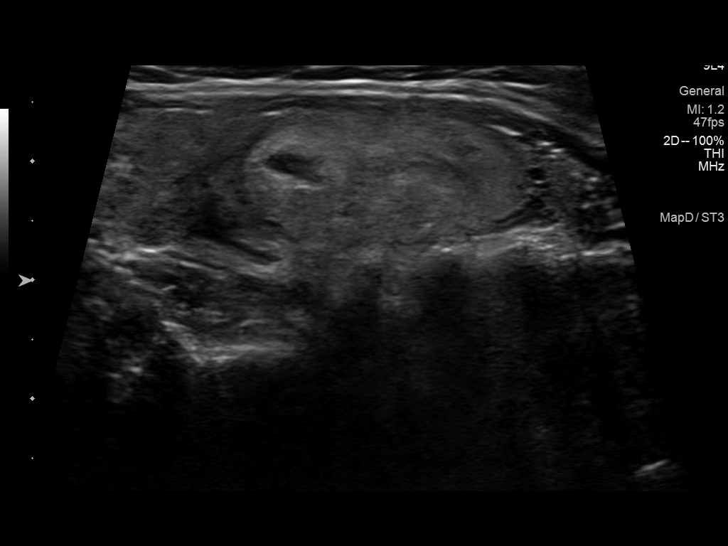
[im 50/50]
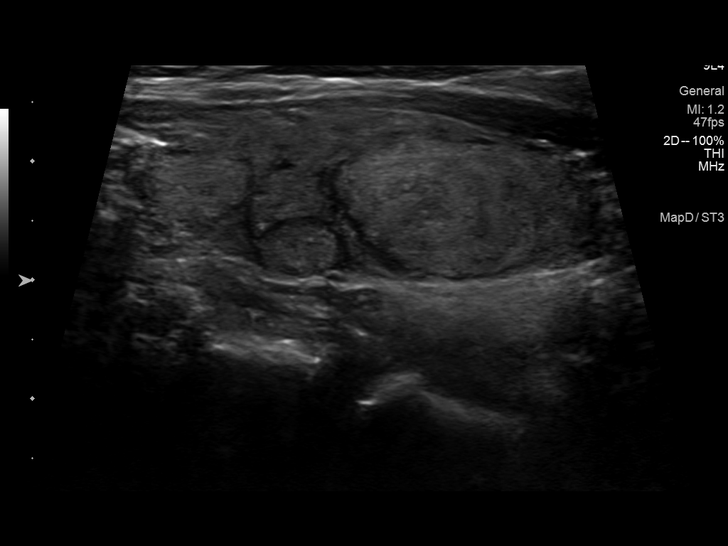

[13 of 25 positions shown; findings below may reference images not displayed]

FINDINGS: Parenchymal Echotexture: Moderately heterogenous

Isthmus: 0.5 cm, previously 0.5 cm

Right lobe: 2.8 x 0.9 x 0.7 cm, previously 3.0 x 1.0 x 1.2 cm

Left lobe: 4.5 x 1.2 x 1.4 cm, previously 4.5 x 1.4 x 2.0 cm

_________________________________________________________

Estimated total number of nodules >/= 1 cm: 2

Number of spongiform nodules >/=  2 cm not described below (TR1): 0

Number of mixed cystic and solid nodules >/= 1.5 cm not described
below (TR2): 0

_________________________________________________________

No discrete right thyroid nodules.

Nodule 1 is a hyperechoic nodule or pseudo nodule in the superior
left thyroid lobe that measures up to 0.9 cm. This would be
compatible with a TR 3 nodule and not criteria for biopsy or
dedicated follow-up.

Nodule 2 is a solid nodule in the mid left thyroid lobe that
measures 0.7 cm and does not meet criteria for biopsy or dedicated
follow-up.

Nodule 3 is the dominant isoechoic nodule in the inferior left
thyroid lobe that likely represents the previously biopsied nodule.
This nodule measures 2.5 x 1.3 x 1.8 cm and previously measured
x 1.3 x 2.1 cm.
IMPRESSION: 1. Multiple thyroid nodules.
2. Dominant left thyroid nodule has not significantly changed since
9827. No new suspicious thyroid nodules.

The above is in keeping with the ACR TI-RADS recommendations - [HOSPITAL] 4102;[DATE].

## 2021-08-24 MED ORDER — AMOXICILLIN-POT CLAVULANATE 875-125 MG PO TABS: 1.0000 | ORAL_TABLET | Freq: Two times a day (BID) | ORAL | 0 refills | Status: DC

## 2021-08-24 NOTE — Patient Instructions (Signed)

## 2021-08-24 NOTE — Progress Notes (Addendum)
Acute Office Visit  Subjective:    Patient ID: Alejandra Alexander, female    DOB: Sep 04, 1941, 80 y.o.   MRN: [id number]  Chief Complaint  Patient presents with   Acute Visit   Nasal Congestion    HPI Patient is in today for c/o sinus pressure, head congestion, ears are stopped up, and sneezing. No sore throat, fever, cough, body aches, diarrhea, or runny nose. Symptoms started 2 days ago. States symptoms have gradually worsened and does not feel well. Patient tested negative for Covid-19 this morning. Denies sick exposures.  Past Medical History:  Diagnosis Date   Allergy    seasonal   Anemia    Chickenpox    Hypertension    Thyroid disease     History reviewed. No pertinent surgical history.  Family History  Problem Relation Age of Onset   Heart disease Father    Stroke Father    Thyroid disease Neg Hx     Social History   Socioeconomic History   Marital status: Widowed    Spouse name: Not on file   Number of children: Not on file   Years of education: Not on file   Highest education level: Not on file  Occupational History   Not on file  Tobacco Use   Smoking status: Never   Smokeless tobacco: Never  Vaping Use   Vaping Use: Never used  Substance and Sexual Activity   Alcohol use: No   Drug use: No   Sexual activity: Not Currently  Other Topics Concern   Not on file  Social History Narrative   Not on file   Social Determinants of Health   Financial Resource Strain: Not on file  Food Insecurity: Not on file  Transportation Needs: Not on file  Physical Activity: Not on file  Stress: Not on file  Social Connections: Not on file  Intimate Partner Violence: Not on file    Outpatient Medications Prior to Visit  Medication Sig Dispense Refill   acetaminophen (TYLENOL) 500 MG tablet Take 1 tablet (500 mg total) by mouth every 6 (six) hours as needed. 30 tablet 0   Cholecalciferol (VITAMIN D3) 25 MCG (1000 UT) CAPS Take 2 capsules by mouth 2 (two)  times daily.     desloratadine (CLARINEX REDITAB) 5 MG disintegrating tablet Take 1 tablet (5 mg total) by mouth daily. 30 tablet 3   fluticasone (FLONASE) 50 MCG/ACT nasal spray Place 1 spray into both nostrils daily. 16 g 0   levothyroxine (SYNTHROID) 50 MCG tablet TAKE 1 TABLET (50 MCG TOTAL) BY MOUTH DAILY. 90 tablet 3   valsartan (DIOVAN) 320 MG tablet TAKE 1 TABLET (320 MG TOTAL) BY MOUTH DAILY. 90 tablet 0   rosuvastatin (CRESTOR) 10 MG tablet Take 1 tablet (10 mg total) by mouth daily. 90 tablet 3   No facility-administered medications prior to visit.    Allergies  Allergen Reactions   Lisinopril Other (See Comments)    Weakness, visual disturbances    Review of Systems Review of Systems:  A fourteen system review of systems was performed and found to be positive as per HPI.    Objective:    Physical Exam General:  Well Developed, well nourished, appropriate for stated age.  Neuro:  Alert and oriented,  extra-ocular muscles intact  HEENT:  Normocephalic, atraumatic, PERRL, sinus tenderness of ethmoid sinus, no tenderness of frontal or maxillary sinus, cerumen impaction of right ear limits visibility of TM, left TM slightly bulging and cloudy, pink  and moist nasal mucosa, erythematous posterior oropharynx, neck supple, +cervical adenopathy (submandibular).  Skin:  no gross rash, warm, pink. Cardiac:  RRR, S1 S2 Respiratory: CTA B/L w/o wheezing, crackles or rales, Not using accessory muscles, speaking in full sentences- unlabored. Vascular:  Ext warm, no cyanosis apprec.; cap RF less 2 sec. Psych:  No HI/SI, judgement and insight good, Euthymic mood. Full Affect.  BP (!) 180/81   Pulse 72   Temp 97.7 F (36.5 C)   Ht 5' 2" (1.575 m)   Wt 135 lb (61.2 kg)   SpO2 98%   BMI 24.69 kg/m  Wt Readings from Last 3 Encounters:  08/24/21 135 lb (61.2 kg)  07/18/21 132 lb 8 oz (60.1 kg)  06/13/21 135 lb 1.6 oz (61.3 kg)    Health Maintenance Due  Topic Date Due    TETANUS/TDAP  Never done   Zoster Vaccines- Shingrix (1 of 2) Never done   DEXA SCAN  Never done   COVID-19 Vaccine (3 - Booster for Pfizer series) 06/29/2020   INFLUENZA VACCINE  06/06/2021    There are no preventive care reminders to display for this patient.   Lab Results  Component Value Date   TSH 0.80 04/05/2021   Lab Results  Component Value Date   WBC 6.4 07/18/2021   HGB 13.8 07/18/2021   HCT 41.3 07/18/2021   MCV 87 07/18/2021   PLT 259 07/18/2021   Lab Results  Component Value Date   NA 142 07/18/2021   K 4.5 07/18/2021   CO2 22 07/18/2021   GLUCOSE 96 07/18/2021   BUN 14 07/18/2021   CREATININE 0.93 07/18/2021   BILITOT 0.7 07/18/2021   ALKPHOS 93 07/18/2021   AST 18 07/18/2021   ALT 15 07/18/2021   PROT 6.9 07/18/2021   ALBUMIN 4.6 07/18/2021   CALCIUM 9.7 07/18/2021   EGFR 62 07/18/2021   Lab Results  Component Value Date   CHOL 168 07/18/2021   Lab Results  Component Value Date   HDL 55 07/18/2021   Lab Results  Component Value Date   LDLCALC 89 07/18/2021   Lab Results  Component Value Date   TRIG 139 07/18/2021   Lab Results  Component Value Date   CHOLHDL 3.1 07/18/2021   Lab Results  Component Value Date   HGBA1C 5.6 08/03/2020       Assessment & Plan:   Problem List Items Addressed This Visit   None Visit Diagnoses     Acute non-recurrent ethmoidal sinusitis    -  Primary   Relevant Orders   POCT Influenza A/B   Head congestion       Relevant Orders   POCT Influenza A/B      Patient's Covid-test this morning resulted negative, will rule out influenza infection (negative). Due to severity and worsening symptoms and with concerns of developing otitis media of left ear will start oral antibiotic therapy. Recommend to continue home supportive care including decongestant such as Mucinex/guaifenesin (avoid sudafed). Follow up if symptoms fail to improve or worsen. BP elevated in office- pt has component of white coat  syndrome. Will repeat and reassess BP at follow up visit with [facility].  No orders of the defined types were placed in this encounter.    Maritza Abonza, PA-C  

## 2021-09-09 ENCOUNTER — Other Ambulatory Visit: Payer: Self-pay | Admitting: Physician Assistant

## 2021-09-09 DIAGNOSIS — I10 Essential (primary) hypertension: Secondary | ICD-10-CM

## 2021-10-11 ENCOUNTER — Ambulatory Visit: Payer: Medicare Other | Admitting: Endocrinology

## 2021-11-18 ENCOUNTER — Encounter: Payer: Self-pay | Admitting: Physician Assistant

## 2021-11-18 ENCOUNTER — Ambulatory Visit (INDEPENDENT_AMBULATORY_CARE_PROVIDER_SITE_OTHER): Payer: Medicare Other | Admitting: Physician Assistant

## 2021-11-18 VITALS — Ht 62.0 in | Wt 132.0 lb

## 2021-11-18 DIAGNOSIS — Z0000 Encounter for general adult medical examination without abnormal findings: Secondary | ICD-10-CM | POA: Diagnosis not present

## 2021-11-18 NOTE — Patient Instructions (Signed)
Preventive Care 65 Years and Older, Female °Preventive care refers to lifestyle choices and visits with your health care provider that can promote health and wellness. Preventive care visits are also called wellness exams. °What can I expect for my preventive care visit? °Counseling °Your health care provider may ask you questions about your: °Medical history, including: °Past medical problems. °Family medical history. °Pregnancy and menstrual history. °History of falls. °Current health, including: °Memory and ability to understand (cognition). °Emotional well-being. °Home life and relationship well-being. °Sexual activity and sexual health. °Lifestyle, including: °Alcohol, nicotine or tobacco, and drug use. °Access to firearms. °Diet, exercise, and sleep habits. °Work and work environment. °Sunscreen use. °Safety issues such as seatbelt and bike helmet use. °Physical exam °Your health care provider will check your: °Height and weight. These may be used to calculate your BMI (body mass index). BMI is a measurement that tells if you are at a healthy weight. °Waist circumference. This measures the distance around your waistline. This measurement also tells if you are at a healthy weight and may help predict your risk of certain diseases, such as type 2 diabetes and high blood pressure. °Heart rate and blood pressure. °Body temperature. °Skin for abnormal spots. °What immunizations do I need? °Vaccines are usually given at various ages, according to a schedule. Your health care provider will recommend vaccines for you based on your age, medical history, and lifestyle or other factors, such as travel or where you work. °What tests do I need? °Screening °Your health care provider may recommend screening tests for certain conditions. This may include: °Lipid and cholesterol levels. °Hepatitis C test. °Hepatitis B test. °HIV (human immunodeficiency virus) test. °STI (sexually transmitted infection) testing, if you are at  risk. °Lung cancer screening. °Colorectal cancer screening. °Diabetes screening. This is done by checking your blood sugar (glucose) after you have not eaten for a while (fasting). °Mammogram. Talk with your health care provider about how often you should have regular mammograms. °BRCA-related cancer screening. This may be done if you have a family history of breast, ovarian, tubal, or peritoneal cancers. °Bone density scan. This is done to screen for osteoporosis. °Talk with your health care provider about your test results, treatment options, and if necessary, the need for more tests. °Follow these instructions at home: °Eating and drinking ° °Eat a diet that includes fresh fruits and vegetables, whole grains, lean protein, and low-fat dairy products. Limit your intake of foods with high amounts of sugar, saturated fats, and salt. °Take vitamin and mineral supplements as recommended by your health care provider. °Do not drink alcohol if your health care provider tells you not to drink. °If you drink alcohol: °Limit how much you have to 0-1 drink a day. °Know how much alcohol is in your drink. In the [place] one drink equals one 12 oz bottle of beer (355 mL), one 5 oz glass of wine (148 mL), or one 1½ oz glass of hard liquor (44 mL). °Lifestyle °Brush your teeth every morning and night with fluoride toothpaste. Floss one time each day. °Exercise for at least 30 minutes 5 or more days each week. °Do not use any products that contain nicotine or tobacco. These products include cigarettes, chewing tobacco, and vaping devices, such as e-cigarettes. If you need help quitting, ask your health care provider. °Do not use drugs. °If you are sexually active, practice safe sex. Use a condom or other form of protection in order to prevent STIs. °Take aspirin only as told by your   health care provider. Make sure that you understand how much to take and what form to take. Work with your health care provider to find out whether it  is safe and beneficial for you to take aspirin daily. Ask your health care provider if you need to take a cholesterol-lowering medicine (statin). Find healthy ways to manage stress, such as: Meditation, yoga, or listening to music. Journaling. Talking to a trusted person. Spending time with friends and family. Minimize exposure to UV radiation to reduce your risk of skin cancer. Safety Always wear your seat belt while driving or riding in a vehicle. Do not drive: If you have been drinking alcohol. Do not ride with someone who has been drinking. When you are tired or distracted. While texting. If you have been using any mind-altering substances or drugs. Wear a helmet and other protective equipment during sports activities. If you have firearms in your house, make sure you follow all gun safety procedures. What's next? Visit your health care provider once a year for an annual wellness visit. Ask your health care provider how often you should have your eyes and teeth checked. Stay up to date on all vaccines. This information is not intended to replace advice given to you by your health care provider. Make sure you discuss any questions you have with your health care provider. Document Revised: 04/20/2021 Document Reviewed: 04/20/2021 Elsevier Patient Education  [place].

## 2021-11-18 NOTE — Progress Notes (Signed)
Subjective:   Alejandra Alexander is a 81 y.o. female who presents for Medicare Annual (Subsequent) preventive examination.  Review of Systems    Refer to PCP   I connected with  Alejandra Alexander on 11/18/21 by an audio only telemedicine application and verified that I am speaking with the correct person using two identifiers.   I discussed the limitations, risks, security and privacy concerns of performing an evaluation and management service by telephone and the availability of in person appointments. I also discussed with the patient that there may be a patient responsible charge related to this service. The patient expressed understanding and verbally consented to this telephonic visit.  Location of Patient: Home Location of Provider:Office  List any persons and their role that are participating in the visit with the patient.       Objective:    Today's Vitals   11/18/21 0819  Weight: 132 lb (59.9 kg)  Height: 5\' 2"  (1.575 m)   Body mass index is 24.14 kg/m.  No flowsheet data found.  Current Medications (verified) Outpatient Encounter Medications as of 11/18/2021  Medication Sig   acetaminophen (TYLENOL) 500 MG tablet Take 1 tablet (500 mg total) by mouth every 6 (six) hours as needed.   Cholecalciferol (VITAMIN D3) 25 MCG (1000 UT) CAPS Take 2 capsules by mouth 2 (two) times daily.   desloratadine (CLARINEX REDITAB) 5 MG disintegrating tablet Take 1 tablet (5 mg total) by mouth daily.   levothyroxine (SYNTHROID) 50 MCG tablet TAKE 1 TABLET (50 MCG TOTAL) BY MOUTH DAILY.   rosuvastatin (CRESTOR) 10 MG tablet Take 1 tablet (10 mg total) by mouth daily.   valsartan (DIOVAN) 320 MG tablet TAKE 1 TABLET (320 MG TOTAL) BY MOUTH DAILY.   [DISCONTINUED] amoxicillin-clavulanate (AUGMENTIN) 875-125 MG tablet Take 1 tablet by mouth 2 (two) times daily.   [DISCONTINUED] fluticasone (FLONASE) 50 MCG/ACT nasal spray Place 1 spray into both nostrils daily. (Patient not taking: Reported on  11/18/2021)   No facility-administered encounter medications on file as of 11/18/2021.    Allergies (verified) Lisinopril   History: Past Medical History:  Diagnosis Date   Allergy    seasonal   Anemia    Chickenpox    Hypertension    Thyroid disease    History reviewed. No pertinent surgical history. Family History  Problem Relation Age of Onset   Heart disease Father    Stroke Father    Thyroid disease Neg Hx    Social History   Socioeconomic History   Marital status: Widowed    Spouse name: Not on file   Number of children: Not on file   Years of education: Not on file   Highest education level: Not on file  Occupational History   Not on file  Tobacco Use   Smoking status: Never   Smokeless tobacco: Never  Vaping Use   Vaping Use: Never used  Substance and Sexual Activity   Alcohol use: No   Drug use: No   Sexual activity: Not Currently  Other Topics Concern   Not on file  Social History Narrative   Not on file   Social Determinants of Health   Financial Resource Strain: Not on file  Food Insecurity: Not on file  Transportation Needs: Not on file  Physical Activity: Not on file  Stress: Not on file  Social Connections: Not on file    Tobacco Counseling Counseling given: Not Answered   Clinical Intake:  Diabetic?No         Activities of Daily Living In your present state of health, do you have any difficulty performing the following activities: 11/18/2021 08/24/2021  Hearing? Y N  Vision? N N  Difficulty concentrating or making decisions? N N  Walking or climbing stairs? N N  Dressing or bathing? N N  Doing errands, shopping? N N  Some recent data might be hidden    Patient Care Team: Mayer Masker, PA-C as PCP - General (Physician Assistant) Runell Gess, MD as PCP - Cardiology (Cardiology) [facility], [place], [place] as Physician Assistant (Physician Assistant) Romero Belling, MD as Consulting Physician  (Endocrinology)  Indicate any recent Medical Services you may have received from other than [facility] providers in the past year (date may be approximate).     Assessment:   This is a routine wellness examination for Alejandra Alexander.  Hearing/Vision screen No results found.  Dietary issues and exercise activities discussed:     Goals Addressed   None   Depression Screen PHQ 2/9 Scores 11/18/2021 08/24/2021 07/18/2021 06/13/2021 04/06/2021 03/10/2021 11/10/2020  PHQ - 2 Score 0 0 0 0 0 0 0  PHQ- 9 Score - 0 0 0 0 0 0  Exception Documentation - - - - - - -    Fall Risk Fall Risk  11/18/2021 08/24/2021 07/18/2021 06/13/2021 04/06/2021  Falls in the past year? 0 0 0 0 0  Number falls in past yr: 0 0 0 0 0  Injury with Fall? 0 0 0 0 0  Risk for fall due to : No Fall Risks No Fall Risks No Fall Risks No Fall Risks No Fall Risks  Follow up Falls evaluation completed Falls evaluation completed Falls evaluation completed Falls evaluation completed Falls evaluation completed    FALL RISK PREVENTION PERTAINING TO THE HOME:  Any stairs in or around the home? No  If so, are there any without handrails? No  Home free of loose throw rugs in walkways, pet beds, electrical cords, etc? Yes  Adequate lighting in your home to reduce risk of falls? Yes   ASSISTIVE DEVICES UTILIZED TO PREVENT FALLS:  Life alert? Yes  Use of a cane, walker or w/c? No  Grab bars in the bathroom? Yes  Shower chair or bench in shower? No  Elevated toilet seat or a handicapped toilet? No   TIMED UP AND GO:  Was the test performed? No .  Length of time to ambulate 10 feet:  sec.   Telehealth   Cognitive Function:     6CIT Screen 11/18/2021 11/10/2020 11/04/2019  What Year? 0 points 0 points 0 points  What month? - 0 points 0 points  What time? 0 points 0 points 0 points  Count back from 20 0 points 0 points 0 points  Months in reverse 4 points 0 points 2 points  Repeat phrase 2 points 4 points 4 points  Total Score - 4 6     Immunizations Immunization History  Administered Date(s) Administered   Fluad Quad(high Dose 65+) 09/12/2019, 10/12/2021   Influenza Whole 09/16/2007, 09/03/2009   Influenza, High Dose Seasonal PF 10/23/2018   Influenza,inj,Quad PF,6+ Mos 09/09/2015   PFIZER(Purple Top)SARS-COV-2 Vaccination 01/04/2020, 01/28/2020   Pneumococcal Conjugate-13 09/09/2015   Pneumococcal Polysaccharide-23 09/06/2005    TDAP status: Due, Education has been provided regarding the importance of this vaccine. Advised may receive this vaccine at local pharmacy or Health Dept. Aware to provide a copy of the vaccination record if obtained from  local pharmacy or Health Dept. Verbalized acceptance and understanding.  Flu Vaccine status: Up to date  Pneumococcal vaccine status: Up to date  Covid-19 vaccine status: Completed vaccines  Qualifies for Shingles Vaccine? Yes   Zostavax completed Yes   Shingrix Completed?: No.    Education has been provided regarding the importance of this vaccine. Patient has been advised to call insurance company to determine out of pocket expense if they have not yet received this vaccine. Advised may also receive vaccine at local pharmacy or Health Dept. Verbalized acceptance and understanding.  Screening Tests Health Maintenance  Topic Date Due   TETANUS/TDAP  Never done   Zoster Vaccines- Shingrix (1 of 2) Never done   DEXA SCAN  Never done   COVID-19 Vaccine (3 - Booster for Pfizer series) 03/24/2020   Pneumonia Vaccine 58+ Years old  Completed   INFLUENZA VACCINE  Completed   HPV VACCINES  Aged Out    Health Maintenance  Health Maintenance Due  Topic Date Due   TETANUS/TDAP  Never done   Zoster Vaccines- Shingrix (1 of 2) Never done   DEXA SCAN  Never done   COVID-19 Vaccine (3 - Booster for Pfizer series) 03/24/2020    Colorectal cancer screening: No longer required.   Mammogram status: No longer required due to age.  Dexa: Declined  Lung Cancer  Screening: (Low Dose CT Chest recommended if Age 65-80 years, 30 pack-year currently smoking OR have quit w/in 15years.) does not qualify.   Lung Cancer Screening Referral:   Additional Screening:  Hepatitis C Screening: does qualify; Patient declined  Vision Screening: Recommended annual ophthalmology exams for early detection of glaucoma and other disorders of the eye. Is the patient up to date with their annual eye exam?  Yes  Who is the provider or what is the name of the office in which the patient attends annual eye exams? [facility]  If pt is not established with a provider, would they like to be referred to a provider to establish care? No .   Dental Screening: Recommended annual dental exams for proper oral hygiene  Community Resource Referral / Chronic Care Management: CRR required this visit?  No   CCM required this visit?  No      Plan:     I have personally reviewed and noted the following in the patients chart:   Medical and social history Use of alcohol, tobacco or illicit drugs  Current medications and supplements including opioid prescriptions.  Functional ability and status Nutritional status Physical activity Advanced directives List of other physicians Hospitalizations, surgeries, and ER visits in previous 12 months Vitals Screenings to include cognitive, depression, and falls Referrals and appointments  In addition, I have reviewed and discussed with patient certain preventive protocols, quality metrics, and best practice recommendations. A written personalized care plan for preventive services as well as general preventive health recommendations were provided to patient.     Mayer Masker, PA-C   11/18/2021   Nurse Notes: Patient does not have any concerns she would like to discuss with the provider. Patient declined to speak with provider.

## 2021-11-25 ENCOUNTER — Ambulatory Visit: Payer: Medicare Other | Admitting: Endocrinology

## 2021-11-25 ENCOUNTER — Other Ambulatory Visit: Payer: Self-pay

## 2021-11-25 VITALS — BP 190/90 | HR 99 | Ht 62.0 in | Wt 134.4 lb

## 2021-11-25 DIAGNOSIS — E042 Nontoxic multinodular goiter: Secondary | ICD-10-CM

## 2021-11-25 NOTE — Progress Notes (Signed)
Subjective:    Patient ID: Alejandra Alexander, female    DOB: [birth year], 81 y.o.   MRN: [id number]  HPI pt returns for f/u of multinodular goiter (first noted in 2006; she has been followed with serial [place] since then; in 2014, she had bx: result was non-neoplasic goiter (bx in 2006 was also benign); also in 2014, she was noted to have an elevated TSH, and started taking synthroid; f/u US is 2021 was unchanged, and no f/u was advised).  pt states she feels well in general.  She does not notice the goiter.  She takes synthroid as rx'ed.  She says BP is normal at home.   Past Medical History:  Diagnosis Date   Allergy    seasonal   Anemia    Chickenpox    Hypertension    Thyroid disease     No past surgical history on file.  Social History   Socioeconomic History   Marital status: Widowed    Spouse name: Not on file   Number of children: Not on file   Years of education: Not on file   Highest education level: Not on file  Occupational History   Not on file  Tobacco Use   Smoking status: Never   Smokeless tobacco: Never  Vaping Use   Vaping Use: Never used  Substance and Sexual Activity   Alcohol use: No   Drug use: No   Sexual activity: Not Currently  Other Topics Concern   Not on file  Social History Narrative   Not on file   Social Determinants of Health   Financial Resource Strain: Not on file  Food Insecurity: Not on file  Transportation Needs: Not on file  Physical Activity: Not on file  Stress: Not on file  Social Connections: Not on file  Intimate Partner Violence: Not on file    Current Outpatient Medications on File Prior to Visit  Medication Sig Dispense Refill   acetaminophen (TYLENOL) 500 MG tablet Take 1 tablet (500 mg total) by mouth every 6 (six) hours as needed. 30 tablet 0   Cholecalciferol (VITAMIN D3) 25 MCG (1000 UT) CAPS Take 2 capsules by mouth 2 (two) times daily.     desloratadine (CLARINEX REDITAB) 5 MG disintegrating tablet Take 1 tablet  (5 mg total) by mouth daily. 30 tablet 3   levothyroxine (SYNTHROID) 50 MCG tablet TAKE 1 TABLET (50 MCG TOTAL) BY MOUTH DAILY. 90 tablet 3   valsartan (DIOVAN) 320 MG tablet TAKE 1 TABLET (320 MG TOTAL) BY MOUTH DAILY. 90 tablet 1   rosuvastatin (CRESTOR) 10 MG tablet Take 1 tablet (10 mg total) by mouth daily. 90 tablet 3   No current facility-administered medications on file prior to visit.    Allergies  Allergen Reactions   Lisinopril Other (See Comments)    Weakness, visual disturbances    Family History  Problem Relation Age of Onset   Heart disease Father    Stroke Father    Thyroid disease Neg Hx     BP (!) 190/90    Pulse 99    Ht 5\' 2"  (1.575 m)    Wt 134 lb 6.4 oz (61 kg)    SpO2 95%    BMI 24.58 kg/m    Review of Systems     Objective:   Physical Exam VITAL SIGNS:  See vs page.   GENERAL: no distress.  NECK: thyroid is slightly enlarged, with irreg surface.     Lab Results  Component  Value Date   TSH 0.80 04/05/2021      Assessment & Plan:  Hypothyroidism.  well-controlled.  Please continue the same synthroid MNG: clinically stable.  We'll follow  Patient Instructions  Please continue the same levothyroxine.   Please come back for a follow-up appointment in 1 year.

## 2021-11-25 NOTE — Patient Instructions (Addendum)
Please continue the same levothyroxine.   Please come back for a follow-up appointment in 1 year.   

## 2022-01-26 ENCOUNTER — Other Ambulatory Visit: Payer: Self-pay | Admitting: Student

## 2022-03-07 ENCOUNTER — Other Ambulatory Visit: Payer: Self-pay | Admitting: Physician Assistant

## 2022-03-07 DIAGNOSIS — I10 Essential (primary) hypertension: Secondary | ICD-10-CM

## 2022-03-16 ENCOUNTER — Ambulatory Visit: Payer: Medicare Other | Admitting: Endocrinology

## 2022-03-16 DIAGNOSIS — J358 Other chronic diseases of tonsils and adenoids: Secondary | ICD-10-CM | POA: Diagnosis not present

## 2022-03-16 DIAGNOSIS — H93293 Other abnormal auditory perceptions, bilateral: Secondary | ICD-10-CM | POA: Diagnosis not present

## 2022-03-16 DIAGNOSIS — H6121 Impacted cerumen, right ear: Secondary | ICD-10-CM | POA: Diagnosis not present

## 2022-05-17 DIAGNOSIS — H903 Sensorineural hearing loss, bilateral: Secondary | ICD-10-CM | POA: Diagnosis not present

## 2022-05-18 ENCOUNTER — Encounter: Payer: Self-pay | Admitting: Physician Assistant

## 2022-05-18 ENCOUNTER — Ambulatory Visit (INDEPENDENT_AMBULATORY_CARE_PROVIDER_SITE_OTHER): Payer: Medicare Other | Admitting: Physician Assistant

## 2022-05-18 VITALS — BP 156/64 | HR 77 | Ht 61.81 in | Wt 135.4 lb

## 2022-05-18 DIAGNOSIS — E039 Hypothyroidism, unspecified: Secondary | ICD-10-CM

## 2022-05-18 DIAGNOSIS — I10 Essential (primary) hypertension: Secondary | ICD-10-CM | POA: Diagnosis not present

## 2022-05-18 DIAGNOSIS — E042 Nontoxic multinodular goiter: Secondary | ICD-10-CM

## 2022-05-18 DIAGNOSIS — E782 Mixed hyperlipidemia: Secondary | ICD-10-CM | POA: Diagnosis not present

## 2022-05-18 DIAGNOSIS — E559 Vitamin D deficiency, unspecified: Secondary | ICD-10-CM | POA: Diagnosis not present

## 2022-05-18 NOTE — Progress Notes (Signed)
Established patient visit   Patient: Alejandra Alexander   DOB: [birth year]   81 y.o. Female  MRN: [id number] Visit Date: 05/18/2022  Chief Complaint  Patient presents with   Follow-up   Subjective    HPI  Patient presents for chronic follow-up.   HTN: Pt denies chest pain, palpitations, dizziness or lower extremity swelling. Taking medication as directed without side effects. Checks BP at home and readings range 138/80. Pt reports has not taken her blood pressure medication yet for today.   HLD: Pt taking medication as directed without issues. Patient reports eats whatever comes to mind but has tried to eat more vegetables. Continues walking daily for an hr weather permitting.  Thyroid: Reports medication compliance. Reports her endocrinologist retired.  Medications: Outpatient Medications Prior to Visit  Medication Sig   acetaminophen (TYLENOL) 500 MG tablet Take 1 tablet (500 mg total) by mouth every 6 (six) hours as needed.   Cholecalciferol (VITAMIN D3) 25 MCG (1000 UT) CAPS Take 2 capsules by mouth 2 (two) times daily.   desloratadine (CLARINEX REDITAB) 5 MG disintegrating tablet Take 1 tablet (5 mg total) by mouth daily.   levothyroxine (SYNTHROID) 50 MCG tablet TAKE 1 TABLET (50 MCG TOTAL) BY MOUTH DAILY.   rosuvastatin (CRESTOR) 10 MG tablet TAKE 1 TABLET BY MOUTH DAILY.   valsartan (DIOVAN) 320 MG tablet TAKE 1 TABLET (320 MG TOTAL) BY MOUTH DAILY.   No facility-administered medications prior to visit.    Review of Systems Review of Systems:  A fourteen system review of systems was performed and found to be positive as per HPI.     Objective    BP (!) 156/64   Pulse 77   Ht 5' 1.81" (1.57 m)   Wt 135 lb 6.4 oz (61.4 kg)   SpO2 97%   BMI 24.92 kg/m  BP Readings from Last 3 Encounters:  05/18/22 (!) 156/64  11/25/21 (!) 190/90  08/24/21 (!) 180/81   Wt Readings from Last 3 Encounters:  05/18/22 135 lb 6.4 oz (61.4 kg)  11/25/21 134 lb 6.4 oz (61 kg)   11/18/21 132 lb (59.9 kg)    Physical Exam  General:  Cooperative, in no acute distress, appropriate for stated age.  Neuro:  Alert and oriented,  extra-ocular muscles intact  HEENT:  Normocephalic, atraumatic, neck supple  Skin:  no gross rash, warm, pink. Cardiac:  RRR, S1 S2 Respiratory: CTA B/L  Vascular:  Ext warm, no cyanosis apprec.; cap RF less 2 sec. Psych:  No HI/SI, judgement and insight good, Euthymic mood. Full Affect.   No results found for any visits on 05/18/22.  Assessment & Plan      Problem List Items Addressed This Visit       Cardiovascular and Mediastinum   Primary hypertension - Primary    -BP elevated in office likely secondary to not taking her BP medication yet. Advised to schedule nurse visit in 2 weeks for BP recheck and ensure to take her medication at least 2 hrs before coming in. Pt verbalized understanding. Will collect CMP for medication monitoring. Continue Valsartan 320 mg daily. Will continue to monitor.      Relevant Orders   CBC w/Diff   Comp Met (CMET)     Endocrine   Multinodular goiter    -Followed by endocrinology.       Relevant Orders   TSH   T4, free   Hypothyroidism    -Followed by endocrinology. -Will collect thyroid labs. Discussed with patient  is unable to get further medication refills through endocrinology to let me know and I can send in a refill until she gets established with new endocrinologist. Pt verbalized understanding. Will continue to monitor.        Other   Hyperlipidemia    -Last lipid panel normal. -Will repeat lipid panel and hepatic function today. -Continue current medication regimen. -Will continue to monitor.      Relevant Orders   Lipid Profile   Vitamin D deficiency    -Last Vitamin D 34.5, will repeat Vitamin D today. Recommend to continue Vitamin D supplement.      Relevant Orders   Vitamin D (25 hydroxy)    Return in about 6 months (around 11/18/2022) for [facility] with [place]; nurse  visit in 2 weeks for BP recheck .        Lorrene Reid, PA-C  [facility] at [facility] [phone] (phone) [phone] (fax)  [place]

## 2022-05-18 NOTE — Assessment & Plan Note (Signed)
-  Last lipid panel normal. -Will repeat lipid panel and hepatic function today. -Continue current medication regimen. -Will continue to monitor.

## 2022-05-18 NOTE — Assessment & Plan Note (Signed)
-  Last Vitamin D 34.5, will repeat Vitamin D today. Recommend to continue Vitamin D supplement.

## 2022-05-18 NOTE — Patient Instructions (Signed)

## 2022-05-18 NOTE — Assessment & Plan Note (Signed)
-  BP elevated in office likely secondary to not taking her BP medication yet. Advised to schedule nurse visit in 2 weeks for BP recheck and ensure to take her medication at least 2 hrs before coming in. Pt verbalized understanding. Will collect CMP for medication monitoring. Continue Valsartan 320 mg daily. Will continue to monitor.

## 2022-05-18 NOTE — Assessment & Plan Note (Signed)
Followed by endocrinology 

## 2022-05-18 NOTE — Assessment & Plan Note (Signed)
-  Followed by endocrinology. -Will collect thyroid labs. Discussed with patient is unable to get further medication refills through endocrinology to let me know and I can send in a refill until she gets established with new endocrinologist. Pt verbalized understanding. Will continue to monitor.

## 2022-05-19 LAB — LIPID PANEL
Chol/HDL Ratio: 3.6 ratio (ref 0.0–4.4)
Cholesterol, Total: 174 mg/dL (ref 100–199)
HDL: 49 mg/dL (ref >39)
LDL Chol Calc (NIH): 95 mg/dL (ref 0–99)
Triglycerides: 175 mg/dL — ABNORMAL HIGH (ref 0–149)
VLDL Cholesterol Cal: 30 mg/dL (ref 5–40)

## 2022-05-19 LAB — COMPREHENSIVE METABOLIC PANEL
ALT: 15 IU/L (ref 0–32)
AST: 16 IU/L (ref 0–40)
Albumin/Globulin Ratio: 2.2 (ref 1.2–2.2)
Albumin: 4.6 g/dL (ref 3.7–4.7)
Alkaline Phosphatase: 96 IU/L (ref 44–121)
BUN/Creatinine Ratio: 21 (ref 12–28)
BUN: 18 mg/dL (ref 8–27)
Bilirubin Total: 0.7 mg/dL (ref 0.0–1.2)
CO2: 20 mmol/L (ref 20–29)
Calcium: 9.4 mg/dL (ref 8.7–10.3)
Chloride: 107 mmol/L — ABNORMAL HIGH (ref 96–106)
Creatinine, Ser: 0.84 mg/dL (ref 0.57–1.00)
Globulin, Total: 2.1 g/dL (ref 1.5–4.5)
Glucose: 100 mg/dL — ABNORMAL HIGH (ref 70–99)
Potassium: 4.4 mmol/L (ref 3.5–5.2)
Sodium: 141 mmol/L (ref 134–144)
Total Protein: 6.7 g/dL (ref 6.0–8.5)
eGFR: 70 mL/min/{1.73_m2} (ref >59)

## 2022-05-19 LAB — TSH: TSH: 2.99 u[IU]/mL (ref 0.450–4.500)

## 2022-05-19 LAB — CBC WITH DIFFERENTIAL/PLATELET
Basophils Absolute: 0 10*3/uL (ref 0.0–0.2)
Basos: 0 %
EOS (ABSOLUTE): 0.3 10*3/uL (ref 0.0–0.4)
Eos: 5 %
Hematocrit: 41.8 % (ref 34.0–46.6)
Hemoglobin: 14.1 g/dL (ref 11.1–15.9)
Immature Grans (Abs): 0 10*3/uL (ref 0.0–0.1)
Immature Granulocytes: 0 %
Lymphocytes Absolute: 2.1 10*3/uL (ref 0.7–3.1)
Lymphs: 35 %
MCH: 29.4 pg (ref 26.6–33.0)
MCHC: 33.7 g/dL (ref 31.5–35.7)
MCV: 87 fL (ref 79–97)
Monocytes Absolute: 0.5 10*3/uL (ref 0.1–0.9)
Monocytes: 9 %
Neutrophils Absolute: 3 10*3/uL (ref 1.4–7.0)
Neutrophils: 51 %
Platelets: 282 10*3/uL (ref 150–450)
RBC: 4.79 x10E6/uL (ref 3.77–5.28)
RDW: 13.3 % (ref 11.7–15.4)
WBC: 5.8 10*3/uL (ref 3.4–10.8)

## 2022-05-19 LAB — T4, FREE: Free T4: 1.07 ng/dL (ref 0.82–1.77)

## 2022-05-19 LAB — VITAMIN D 25 HYDROXY (VIT D DEFICIENCY, FRACTURES): Vit D, 25-Hydroxy: 21.6 ng/mL — ABNORMAL LOW (ref 30.0–100.0)

## 2022-06-01 ENCOUNTER — Inpatient Hospital Stay: Payer: Medicare Other

## 2022-06-12 ENCOUNTER — Encounter: Payer: Self-pay | Admitting: Physician Assistant

## 2022-06-12 ENCOUNTER — Ambulatory Visit (INDEPENDENT_AMBULATORY_CARE_PROVIDER_SITE_OTHER): Payer: Medicare Other | Admitting: Physician Assistant

## 2022-06-12 VITALS — BP 148/82 | HR 71 | Temp 97.9°F | Ht 62.0 in | Wt 134.0 lb

## 2022-06-12 DIAGNOSIS — I10 Essential (primary) hypertension: Secondary | ICD-10-CM

## 2022-06-12 MED ORDER — VALSARTAN-HYDROCHLOROTHIAZIDE 320-12.5 MG PO TABS: 1.0000 | ORAL_TABLET | Freq: Every day | ORAL | 0 refills | Status: DC

## 2022-06-12 NOTE — Patient Instructions (Signed)

## 2022-06-12 NOTE — Progress Notes (Signed)
  Established patient visit   Patient: Alejandra Alexander   DOB: Mar 16, 1941   81 y.o. Female  MRN: [id number] Visit Date: 06/12/2022  Chief Complaint  Patient presents with   Follow-up   Subjective    HPI  Patient presents for follow-up on hypertension. Pt denies chest pain, palpitations, dizziness, headache or lower extremity swelling. Taking medication as directed without side effects. Checks BP at home and readings range have been 140-150s/80. Pt denies excessive sodium intake. Continues with daily walks in the mornings.     Medications: Outpatient Medications Prior to Visit  Medication Sig   acetaminophen (TYLENOL) 500 MG tablet Take 1 tablet (500 mg total) by mouth every 6 (six) hours as needed.   Cholecalciferol (VITAMIN D3) 25 MCG (1000 UT) CAPS Take 2 capsules by mouth 2 (two) times daily.   desloratadine (CLARINEX REDITAB) 5 MG disintegrating tablet Take 1 tablet (5 mg total) by mouth daily.   levothyroxine (SYNTHROID) 50 MCG tablet TAKE 1 TABLET (50 MCG TOTAL) BY MOUTH DAILY.   rosuvastatin (CRESTOR) 10 MG tablet TAKE 1 TABLET BY MOUTH DAILY.   [DISCONTINUED] valsartan (DIOVAN) 320 MG tablet TAKE 1 TABLET (320 MG TOTAL) BY MOUTH DAILY.   No facility-administered medications prior to visit.    Review of Systems     Objective    BP (!) 148/82   Pulse 71   Temp 97.9 F (36.6 C) (Temporal)   Ht 5\' 2"  (1.575 m)   Wt 134 lb (60.8 kg)   BMI 24.51 kg/m  BP Readings from Last 3 Encounters:  06/12/22 (!) 148/82  05/18/22 (!) 156/64  11/25/21 (!) 190/90   Wt Readings from Last 3 Encounters:  06/12/22 134 lb (60.8 kg)  05/18/22 135 lb 6.4 oz (61.4 kg)  11/25/21 134 lb 6.4 oz (61 kg)    Physical Exam  General:  Pleasant and cooperative, appropriate for stated age.  Neuro:  Alert and oriented,  extra-ocular muscles intact  HEENT:  Normocephalic, atraumatic, neck supple  Skin:  no gross rash, warm, pink. Cardiac:  RRR, S1 S2 Respiratory: CTA B/L w/o wheezing,  crackles or rales. Vascular:  Ext warm, no cyanosis apprec.; cap RF less 2 sec. Psych:  No HI/SI, judgement and insight good, Euthymic mood. Full Affect.   No results found for any visits on 06/12/22.  Assessment & Plan      Problem List Items Addressed This Visit       Cardiovascular and Mediastinum   Primary hypertension - Primary    -BP elevated in office and ambulatory readings have been elevated. Discussed with patient changing BP medication to Valsartan-HCTZ 320-12.5 mg. Patient is agreeable. Advised to let me know if unable to tolerate new medication. Pt verbalized understanding. Advised to schedule a lab/nurse visit in 4 weeks to repeat BP and also collect CMP for medication monitoring. Continue ambulatory BP monitoring. BP goal<140/90. Will continue to monitor.      Relevant Medications   valsartan-hydrochlorothiazide (DIOVAN-HCT) 320-12.5 MG tablet     Return in about 6 months (around 12/13/2022) for [facility] and [facility]; lab/nurse visit in 4 weeks for BP recheck and CMP.        02/11/2023, PA-C  [facility] at [facility] [phone] (phone) [phone] (fax)  [facility]

## 2022-06-12 NOTE — Assessment & Plan Note (Addendum)
-  BP elevated in office and ambulatory readings have been elevated. Discussed with patient changing BP medication to Valsartan-HCTZ 320-12.5 mg. Patient is agreeable. Advised to let me know if unable to tolerate new medication. Pt verbalized understanding. Advised to schedule a lab/nurse visit in 4 weeks to repeat BP and also collect CMP for medication monitoring. Continue ambulatory BP monitoring. BP goal<140/90. Will continue to monitor.

## 2022-07-11 ENCOUNTER — Other Ambulatory Visit: Payer: Medicare Other

## 2022-07-11 ENCOUNTER — Other Ambulatory Visit: Payer: Self-pay

## 2022-07-11 DIAGNOSIS — I10 Essential (primary) hypertension: Secondary | ICD-10-CM | POA: Diagnosis not present

## 2022-07-12 LAB — COMPREHENSIVE METABOLIC PANEL
ALT: 33 IU/L — ABNORMAL HIGH (ref 0–32)
AST: 25 IU/L (ref 0–40)
Albumin/Globulin Ratio: 2.3 — ABNORMAL HIGH (ref 1.2–2.2)
Albumin: 4.5 g/dL (ref 3.7–4.7)
Alkaline Phosphatase: 129 IU/L — ABNORMAL HIGH (ref 44–121)
BUN/Creatinine Ratio: 20 (ref 12–28)
BUN: 20 mg/dL (ref 8–27)
Bilirubin Total: 0.5 mg/dL (ref 0.0–1.2)
CO2: 18 mmol/L — ABNORMAL LOW (ref 20–29)
Calcium: 9.7 mg/dL (ref 8.7–10.3)
Chloride: 102 mmol/L (ref 96–106)
Creatinine, Ser: 0.99 mg/dL (ref 0.57–1.00)
Globulin, Total: 2 g/dL (ref 1.5–4.5)
Glucose: 106 mg/dL — ABNORMAL HIGH (ref 70–99)
Potassium: 4.4 mmol/L (ref 3.5–5.2)
Sodium: 139 mmol/L (ref 134–144)
Total Protein: 6.5 g/dL (ref 6.0–8.5)
eGFR: 57 mL/min/{1.73_m2} — ABNORMAL LOW (ref >59)

## 2022-08-07 ENCOUNTER — Telehealth: Payer: Self-pay

## 2022-08-07 ENCOUNTER — Other Ambulatory Visit: Payer: Self-pay

## 2022-08-07 DIAGNOSIS — E039 Hypothyroidism, unspecified: Secondary | ICD-10-CM

## 2022-08-07 MED ORDER — LEVOTHYROXINE SODIUM 50 MCG PO TABS: 50.0000 ug | ORAL_TABLET | Freq: Every day | ORAL | 3 refills | Status: DC

## 2022-08-07 NOTE — Telephone Encounter (Signed)
Per Herb Grays sent in refill for levothyroxine to [facility] drug pharmacy. Sent patient a Mychart message to notify her that refill request has been sent in.

## 2022-08-07 NOTE — Telephone Encounter (Signed)
Patient came in office regarding a medication refill on her levothyroxine sent to her pharmacy, please advise, thanks!

## 2022-08-11 ENCOUNTER — Other Ambulatory Visit: Payer: Self-pay

## 2022-08-11 DIAGNOSIS — I10 Essential (primary) hypertension: Secondary | ICD-10-CM

## 2022-08-11 DIAGNOSIS — E782 Mixed hyperlipidemia: Secondary | ICD-10-CM

## 2022-08-11 DIAGNOSIS — E042 Nontoxic multinodular goiter: Secondary | ICD-10-CM

## 2022-08-14 ENCOUNTER — Other Ambulatory Visit: Payer: Medicare Other

## 2022-08-14 DIAGNOSIS — E782 Mixed hyperlipidemia: Secondary | ICD-10-CM | POA: Diagnosis not present

## 2022-08-14 DIAGNOSIS — E042 Nontoxic multinodular goiter: Secondary | ICD-10-CM

## 2022-08-14 DIAGNOSIS — I10 Essential (primary) hypertension: Secondary | ICD-10-CM

## 2022-08-15 LAB — COMPREHENSIVE METABOLIC PANEL
ALT: 20 IU/L (ref 0–32)
AST: 22 IU/L (ref 0–40)
Albumin/Globulin Ratio: 2 (ref 1.2–2.2)
Albumin: 4.5 g/dL (ref 3.7–4.7)
Alkaline Phosphatase: 108 IU/L (ref 44–121)
BUN/Creatinine Ratio: 22 (ref 12–28)
BUN: 22 mg/dL (ref 8–27)
Bilirubin Total: 0.4 mg/dL (ref 0.0–1.2)
CO2: 23 mmol/L (ref 20–29)
Calcium: 9.7 mg/dL (ref 8.7–10.3)
Chloride: 102 mmol/L (ref 96–106)
Creatinine, Ser: 1 mg/dL (ref 0.57–1.00)
Globulin, Total: 2.3 g/dL (ref 1.5–4.5)
Glucose: 101 mg/dL — ABNORMAL HIGH (ref 70–99)
Potassium: 4.6 mmol/L (ref 3.5–5.2)
Sodium: 138 mmol/L (ref 134–144)
Total Protein: 6.8 g/dL (ref 6.0–8.5)
eGFR: 57 mL/min/{1.73_m2} — ABNORMAL LOW (ref >59)

## 2022-08-15 LAB — CBC WITH DIFFERENTIAL/PLATELET
Basophils Absolute: 0 10*3/uL (ref 0.0–0.2)
Basos: 0 %
EOS (ABSOLUTE): 0.2 10*3/uL (ref 0.0–0.4)
Eos: 4 %
Hematocrit: 41.3 % (ref 34.0–46.6)
Hemoglobin: 13.8 g/dL (ref 11.1–15.9)
Immature Grans (Abs): 0 10*3/uL (ref 0.0–0.1)
Immature Granulocytes: 0 %
Lymphocytes Absolute: 2 10*3/uL (ref 0.7–3.1)
Lymphs: 34 %
MCH: 29.2 pg (ref 26.6–33.0)
MCHC: 33.4 g/dL (ref 31.5–35.7)
MCV: 87 fL (ref 79–97)
Monocytes Absolute: 0.5 10*3/uL (ref 0.1–0.9)
Monocytes: 8 %
Neutrophils Absolute: 3 10*3/uL (ref 1.4–7.0)
Neutrophils: 54 %
Platelets: 288 10*3/uL (ref 150–450)
RBC: 4.73 x10E6/uL (ref 3.77–5.28)
RDW: 13.2 % (ref 11.7–15.4)
WBC: 5.7 10*3/uL (ref 3.4–10.8)

## 2022-08-15 LAB — LIPID PANEL
Chol/HDL Ratio: 5 ratio — ABNORMAL HIGH (ref 0.0–4.4)
Cholesterol, Total: 248 mg/dL — ABNORMAL HIGH (ref 100–199)
HDL: 50 mg/dL (ref >39)
LDL Chol Calc (NIH): 175 mg/dL — ABNORMAL HIGH (ref 0–99)
Triglycerides: 130 mg/dL (ref 0–149)
VLDL Cholesterol Cal: 23 mg/dL (ref 5–40)

## 2022-08-15 LAB — TSH: TSH: 2.16 u[IU]/mL (ref 0.450–4.500)

## 2022-10-18 ENCOUNTER — Other Ambulatory Visit: Payer: Self-pay | Admitting: Nurse Practitioner

## 2022-10-18 DIAGNOSIS — I10 Essential (primary) hypertension: Secondary | ICD-10-CM

## 2022-11-20 ENCOUNTER — Other Ambulatory Visit: Payer: Self-pay

## 2022-11-20 ENCOUNTER — Ambulatory Visit (INDEPENDENT_AMBULATORY_CARE_PROVIDER_SITE_OTHER): Payer: Medicare Other | Admitting: Nurse Practitioner

## 2022-11-20 ENCOUNTER — Encounter: Payer: Self-pay | Admitting: Nurse Practitioner

## 2022-11-20 VITALS — Ht 62.0 in | Wt 134.0 lb

## 2022-11-20 DIAGNOSIS — E2839 Other primary ovarian failure: Secondary | ICD-10-CM | POA: Diagnosis not present

## 2022-11-20 DIAGNOSIS — Z13820 Encounter for screening for osteoporosis: Secondary | ICD-10-CM

## 2022-11-20 DIAGNOSIS — Z0000 Encounter for general adult medical examination without abnormal findings: Secondary | ICD-10-CM

## 2022-11-20 DIAGNOSIS — E039 Hypothyroidism, unspecified: Secondary | ICD-10-CM

## 2022-11-20 MED ORDER — ROSUVASTATIN CALCIUM 10 MG PO TABS: 10.0000 mg | ORAL_TABLET | Freq: Every day | ORAL | 0 refills | Status: DC

## 2022-11-20 NOTE — Progress Notes (Signed)
Subjective:   Alejandra Alexander is a 82 y.o. female who presents for Medicare Annual (Subsequent) preventive examination. -aged out of mammogram  -might need bone density. States that she had one, but she doesn't remember when. States it has been a long time.  -recent TSH normal.  -mildly elevated lipid panel with elevated HDL/CHOL ratio.  -She denies chest pain, chest pressure, or shortness of breath. She denies headaches or visual disturbances. She denies abdominal pain, nausea, vomiting, or changes in bowel or bladder habits.    Review of Systems    See HPI      Objective:    Today's Vitals   11/20/22 0838  Weight: 134 lb (60.8 kg)  Height: 5\' 2"  (1.575 m)   Body mass index is 24.51 kg/m.   Current Medications (verified) Outpatient Encounter Medications as of 11/20/2022  Medication Sig   acetaminophen (TYLENOL) 500 MG tablet Take 1 tablet (500 mg total) by mouth every 6 (six) hours as needed.   Cholecalciferol (VITAMIN D3) 25 MCG (1000 UT) CAPS Take 2 capsules by mouth 2 (two) times daily.   desloratadine (CLARINEX REDITAB) 5 MG disintegrating tablet Take 1 tablet (5 mg total) by mouth daily.   levothyroxine (SYNTHROID) 50 MCG tablet Take 1 tablet (50 mcg total) by mouth daily.   valsartan-hydrochlorothiazide (DIOVAN-HCT) 320-12.5 MG tablet TAKE 1 TABLET BY MOUTH DAILY.   [DISCONTINUED] rosuvastatin (CRESTOR) 10 MG tablet TAKE 1 TABLET BY MOUTH DAILY.   FLUAD QUADRIVALENT 0.5 ML injection  (Patient not taking: Reported on 11/20/2022)   No facility-administered encounter medications on file as of 11/20/2022.    Allergies (verified) Lisinopril   History: Past Medical History:  Diagnosis Date   Allergy    seasonal   Anemia    Chickenpox    Hypertension    Thyroid disease    History reviewed. No pertinent surgical history. Family History  Problem Relation Age of Onset   Heart disease Father    Stroke Father    Thyroid disease Neg Hx    Social History    Socioeconomic History   Marital status: Widowed    Spouse name: Not on file   Number of children: Not on file   Years of education: Not on file   Highest education level: Not on file  Occupational History   Not on file  Tobacco Use   Smoking status: Never   Smokeless tobacco: Never  Vaping Use   Vaping Use: Never used  Substance and Sexual Activity   Alcohol use: No   Drug use: No   Sexual activity: Not Currently  Other Topics Concern   Not on file  Social History Narrative   Not on file   Social Determinants of Health   Financial Resource Strain: Low Risk  (11/20/2022)   Overall Financial Resource Strain (CARDIA)    Difficulty of Paying Living Expenses: Not hard at all  Food Insecurity: No Food Insecurity (11/20/2022)   Hunger Vital Sign    Worried About Running Out of Food in the Last Year: Never true    Ran Out of Food in the Last Year: Never true  Transportation Needs: No Transportation Needs (11/20/2022)   PRAPARE - 11/22/2022 (Medical): No    Lack of Transportation (Non-Medical): No  Physical Activity: Sufficiently Active (11/20/2022)   Exercise Vital Sign    Days of Exercise per Week: 5 days    Minutes of Exercise per Session: 50 min  Stress: No Stress  Concern Present (11/20/2022)   [place]    Feeling of Stress : Not at all  Social Connections: Moderately Isolated (11/20/2022)   Social Connection and Isolation Panel [NHANES]    Frequency of Communication with Friends and Family: More than three times a week    Frequency of Social Gatherings with Friends and Family: More than three times a week    Attends Religious Services: More than 4 times per year    Active Member of [employer] or Organizations: No    Attends Archivist Meetings: Never    Marital Status: Widowed    Tobacco Counseling Counseling given: Not Answered   Clinical Intake:  Pre-visit  preparation completed: Yes  Pain : No/denies pain     BMI - recorded: 24.51 Nutritional Status: BMI of 19-24  Normal Diabetes: No  How often do you need to have someone help you when you read instructions, pamphlets, or other written materials from your doctor or pharmacy?: 1 - Never  Diabetic?no  Interpreter Needed?: No      Activities of Daily Living   Row Labels 11/20/2022    8:49 AM 06/12/2022   11:03 AM  In your present state of health, do you have any difficulty performing the following activities:   Section Header. No data exists in this row.    Hearing?   0 1  Vision?   1 0  Difficulty concentrating or making decisions?   0 0  Walking or climbing stairs?   0 0  Dressing or bathing?   0 0  Doing errands, shopping?   0 0    Patient Care Team: Lorrene Reid, PA-C as PCP - General (Physician Assistant) Lorretta Harp, MD as PCP - Cardiology (Cardiology) [place], Tami Lin, Theresa as Physician Assistant (Physician Assistant) Renato Shin, MD (Inactive) as Consulting Physician (Endocrinology)  Indicate any recent Medical Services you may have received from other than [facility] providers in the past year (date may be approximate).     Assessment:   1. Encounter for Medicare annual wellness exam Annual medicare wellness visit today   2. Acquired hypothyroidism Stable. Continue levothyroxine at current dose.   3. Ovarian failure Bone density test ordered today  - DG Bone Density; Future  4. Screening for osteoporosis Bone density test ordered today  - DG Bone Density; Future   Hearing/Vision screen No results found.   Depression Screen   Row Labels 11/20/2022    8:46 AM 06/12/2022   11:04 AM 05/18/2022    8:34 AM 11/18/2021    8:23 AM 08/24/2021   10:30 AM 07/18/2021    8:57 AM 06/13/2021    3:28 PM  PHQ 2/9 Scores   Section Header. No data exists in this row.         PHQ - 2 Score   0 0 0 0 0 0 0  PHQ- 9 Score   0 0 0  0 0 0    Fall Risk   Row Labels  06/12/2022   11:03 AM 11/18/2021    8:21 AM 08/24/2021   10:30 AM 07/18/2021    8:57 AM 06/13/2021    3:27 PM  Fall Risk    Section Header. No data exists in this row.       Falls in the past year?   0 0 0 0 0  Number falls in past yr:   0 0 0 0 0  Injury with Fall?  0 0 0 0 0  Risk for fall due to :   No Fall Risks No Fall Risks No Fall Risks No Fall Risks No Fall Risks  Follow up   Falls evaluation completed Falls evaluation completed Falls evaluation completed Falls evaluation completed Falls evaluation completed    FALL RISK PREVENTION PERTAINING TO THE HOME:  Any stairs in or around the home? No  If so, are there any without handrails? No  Home free of loose throw rugs in walkways, pet beds, electrical cords, etc? Yes  Adequate lighting in your home to reduce risk of falls? Yes   ASSISTIVE DEVICES UTILIZED TO PREVENT FALLS:  Life alert? Yes  Use of a cane, walker or w/c? No  Grab bars in the bathroom? Yes  Shower chair or bench in shower? No  Elevated toilet seat or a handicapped toilet? No   TIMED UP AND GO:  Was the test performed? No .  Length of time to ambulate 10 feet: 0 sec.   Unable to assess Gait. Pt not in office  Cognitive Function:       Row Labels 11/20/2022    8:43 AM 11/18/2021    8:24 AM 11/10/2020   11:29 AM 11/04/2019   10:46 AM  6CIT Screen   Section Header. No data exists in this row.      What Year?   0 points 0 points 0 points 0 points  What month?   0 points  0 points 0 points  What time?   0 points 0 points 0 points 0 points  Count back from 20   0 points 0 points 0 points 0 points  Months in reverse   0 points 4 points 0 points 2 points  Repeat phrase   0 points 2 points 4 points 4 points  Total Score   0 points  4 points 6 points    Immunizations Immunization History  Administered Date(s) Administered   Covid-19, Mrna,Vaccine(Spikevax)45yrs and older 11/02/2022   Fluad Quad(high Dose 65+) 09/12/2019, 10/12/2021   Influenza Whole  09/16/2007, 09/03/2009   Influenza, High Dose Seasonal PF 10/23/2018   Influenza,inj,Quad PF,6+ Mos 09/09/2015   Influenza-Unspecified 07/27/2022   PFIZER(Purple Top)SARS-COV-2 Vaccination 01/04/2020, 01/28/2020   Pneumococcal Conjugate-13 09/09/2015   Pneumococcal Polysaccharide-23 09/06/2005    TDAP status: Patient wishes to receive this vaccine today after disclosing that insurance does not cover the vaccine as a preventative vaccine.   Flu Vaccine status: Up to date  Pneumococcal vaccine status: Up to date  Covid-19 vaccine status: Completed vaccines  Qualifies for Shingles Vaccine? No   Zostavax completed No   Shingrix Completed?: No.    Education has been provided regarding the importance of this vaccine. Patient has been advised to call insurance company to determine out of pocket expense if they have not yet received this vaccine. Advised may also receive vaccine at local pharmacy or Health Dept. Verbalized acceptance and understanding.  Screening Tests Health Maintenance  Topic Date Due   DTaP/Tdap/Td (1 - Tdap) Never done   Zoster Vaccines- Shingrix (1 of 2) Never done   DEXA SCAN  Never done   Medicare Annual Wellness (AWV)  11/18/2022   Pneumonia Vaccine 3+ Years old  Completed   INFLUENZA VACCINE  Completed   COVID-19 Vaccine  Completed   HPV VACCINES  Aged Out    Health Maintenance  Health Maintenance Due  Topic Date Due   DTaP/Tdap/Td (1 - Tdap) Never done   Zoster Vaccines- Shingrix (1  of 2) Never done   DEXA SCAN  Never done   Medicare Annual Wellness (AWV)  11/18/2022    Colorectal cancer screening: No longer required.   Mammogram status: No longer required due to age out .  Bone Density status: Ordered 11/20/2022. Pt provided with contact info and advised to call to schedule appt.  Lung Cancer Screening: (Low Dose CT Chest recommended if Age 72-80 years, 30 pack-year currently smoking OR have quit w/in 15years.) does not qualify.   Lung Cancer  Screening Referral: n/a  Additional Screening:  Hepatitis C Screening: does not qualify; Completed n/a  Vision Screening: Recommended annual ophthalmology exams for early detection of glaucoma and other disorders of the eye. Is the patient up to date with their annual eye exam?  Yes  Who is the provider or what is the name of the office in which the patient attends annual eye exams? [facility] in [place] If pt is not established with a provider, would they like to be referred to a provider to establish care? No .   Dental Screening: Recommended annual dental exams for proper oral hygiene  Community Resource Referral / Chronic Care Management: CRR required this visit?  No   CCM required this visit?  No      Plan:     I have personally reviewed and noted the following in the patient's chart:   Medical and social history Use of alcohol, tobacco or illicit drugs  Current medications and supplements including opioid prescriptions. Patient is not currently taking opioid prescriptions. Functional ability and status Nutritional status Physical activity Advanced directives List of other physicians Hospitalizations, surgeries, and ER visits in previous 12 months Vitals Screenings to include cognitive, depression, and falls Referrals and appointments  In addition, I have reviewed and discussed with patient certain preventive protocols, quality metrics, and best practice recommendations. A written personalized care plan for preventive services as well as general preventive health recommendations were provided to patient.   I connected with Alejandra Alexander on 11/20/22 by audio only telemedicine application and verified that I am speaking with the correct person using two identifiers.   I discussed the limitations, risks, security and privacy concerns of performing an evaluation and management service by telephone and the availability of in person appointments. I also discussed  with the patient responsible charge related to this service. The patient expressed understanding and verbally consented to this telephonic visit.   Locations of Patient:home  Location of Provider: [place] [facility] at [facility]    List any persons and their role that are participating in this visit with patient:    Ronnell Freshwater, NP   11/20/2022   Nurse Notes: 25 min non face to face

## 2022-11-24 ENCOUNTER — Ambulatory Visit: Payer: Medicare Other | Admitting: Endocrinology

## 2023-02-09 ENCOUNTER — Telehealth: Payer: Self-pay | Admitting: *Deleted

## 2023-02-09 DIAGNOSIS — E039 Hypothyroidism, unspecified: Secondary | ICD-10-CM

## 2023-02-09 MED ORDER — LEVOTHYROXINE SODIUM 50 MCG PO TABS: 50.0000 ug | ORAL_TABLET | Freq: Every day | ORAL | 0 refills | Status: DC

## 2023-02-09 NOTE — Telephone Encounter (Signed)
Refill sent.

## 2023-02-09 NOTE — Telephone Encounter (Signed)
Pt came into office requesting a refill on below.     levothyroxine (SYNTHROID) 50 MCG tablet    [facility] - [place], [place] - [address]    LOV 11/20/22 ROV 05/21/23

## 2023-04-17 ENCOUNTER — Other Ambulatory Visit: Payer: Self-pay | Admitting: Nurse Practitioner

## 2023-05-09 ENCOUNTER — Other Ambulatory Visit: Payer: Self-pay

## 2023-05-09 DIAGNOSIS — Z130 Encounter for screening for diseases of the blood and blood-forming organs and certain disorders involving the immune mechanism: Secondary | ICD-10-CM

## 2023-05-09 DIAGNOSIS — Z0000 Encounter for general adult medical examination without abnormal findings: Secondary | ICD-10-CM

## 2023-05-16 ENCOUNTER — Other Ambulatory Visit: Payer: Medicare Other

## 2023-05-16 DIAGNOSIS — Z1329 Encounter for screening for other suspected endocrine disorder: Secondary | ICD-10-CM | POA: Diagnosis not present

## 2023-05-16 DIAGNOSIS — E039 Hypothyroidism, unspecified: Secondary | ICD-10-CM | POA: Diagnosis not present

## 2023-05-16 DIAGNOSIS — Z0000 Encounter for general adult medical examination without abnormal findings: Secondary | ICD-10-CM

## 2023-05-16 DIAGNOSIS — Z130 Encounter for screening for diseases of the blood and blood-forming organs and certain disorders involving the immune mechanism: Secondary | ICD-10-CM | POA: Diagnosis not present

## 2023-05-16 DIAGNOSIS — R7303 Prediabetes: Secondary | ICD-10-CM | POA: Diagnosis not present

## 2023-05-16 DIAGNOSIS — E785 Hyperlipidemia, unspecified: Secondary | ICD-10-CM | POA: Diagnosis not present

## 2023-05-16 DIAGNOSIS — Z13228 Encounter for screening for other metabolic disorders: Secondary | ICD-10-CM | POA: Diagnosis not present

## 2023-05-16 DIAGNOSIS — Z1321 Encounter for screening for nutritional disorder: Secondary | ICD-10-CM | POA: Diagnosis not present

## 2023-05-17 LAB — LIPID PANEL
Chol/HDL Ratio: 4.9 ratio — ABNORMAL HIGH (ref 0.0–4.4)
Cholesterol, Total: 271 mg/dL — ABNORMAL HIGH (ref 100–199)
HDL: 55 mg/dL (ref >39)
LDL Chol Calc (NIH): 177 mg/dL — ABNORMAL HIGH (ref 0–99)
Triglycerides: 211 mg/dL — ABNORMAL HIGH (ref 0–149)
VLDL Cholesterol Cal: 39 mg/dL (ref 5–40)

## 2023-05-17 LAB — CBC WITH DIFFERENTIAL/PLATELET
Basophils Absolute: 0 10*3/uL (ref 0.0–0.2)
Basos: 0 %
EOS (ABSOLUTE): 0.3 10*3/uL (ref 0.0–0.4)
Eos: 4 %
Hematocrit: 41.5 % (ref 34.0–46.6)
Hemoglobin: 14.1 g/dL (ref 11.1–15.9)
Immature Grans (Abs): 0 10*3/uL (ref 0.0–0.1)
Immature Granulocytes: 0 %
Lymphocytes Absolute: 2 10*3/uL (ref 0.7–3.1)
Lymphs: 30 %
MCH: 30 pg (ref 26.6–33.0)
MCHC: 34 g/dL (ref 31.5–35.7)
MCV: 88 fL (ref 79–97)
Monocytes Absolute: 0.5 10*3/uL (ref 0.1–0.9)
Monocytes: 8 %
Neutrophils Absolute: 3.8 10*3/uL (ref 1.4–7.0)
Neutrophils: 58 %
Platelets: 261 10*3/uL (ref 150–450)
RBC: 4.7 x10E6/uL (ref 3.77–5.28)
RDW: 13.1 % (ref 11.7–15.4)
WBC: 6.6 10*3/uL (ref 3.4–10.8)

## 2023-05-17 LAB — COMPREHENSIVE METABOLIC PANEL
ALT: 20 IU/L (ref 0–32)
AST: 22 IU/L (ref 0–40)
Albumin: 4.6 g/dL (ref 3.7–4.7)
Alkaline Phosphatase: 97 IU/L (ref 44–121)
BUN/Creatinine Ratio: 18 (ref 12–28)
BUN: 16 mg/dL (ref 8–27)
Bilirubin Total: 0.7 mg/dL (ref 0.0–1.2)
CO2: 19 mmol/L — ABNORMAL LOW (ref 20–29)
Calcium: 9.8 mg/dL (ref 8.7–10.3)
Chloride: 104 mmol/L (ref 96–106)
Creatinine, Ser: 0.89 mg/dL (ref 0.57–1.00)
Globulin, Total: 2.2 g/dL (ref 1.5–4.5)
Glucose: 104 mg/dL — ABNORMAL HIGH (ref 70–99)
Potassium: 4.3 mmol/L (ref 3.5–5.2)
Sodium: 139 mmol/L (ref 134–144)
Total Protein: 6.8 g/dL (ref 6.0–8.5)
eGFR: 65 mL/min/{1.73_m2} (ref >59)

## 2023-05-17 LAB — TSH: TSH: 2.85 u[IU]/mL (ref 0.450–4.500)

## 2023-05-17 LAB — HEMOGLOBIN A1C
Est. average glucose Bld gHb Est-mCnc: 123 mg/dL
Hgb A1c MFr Bld: 5.9 % — ABNORMAL HIGH (ref 4.8–5.6)

## 2023-05-21 ENCOUNTER — Ambulatory Visit (INDEPENDENT_AMBULATORY_CARE_PROVIDER_SITE_OTHER): Payer: Medicare Other | Admitting: Family Medicine

## 2023-05-21 ENCOUNTER — Encounter: Payer: Self-pay | Admitting: Family Medicine

## 2023-05-21 VITALS — BP 148/73 | HR 94 | Resp 18

## 2023-05-21 DIAGNOSIS — E782 Mixed hyperlipidemia: Secondary | ICD-10-CM

## 2023-05-21 DIAGNOSIS — E039 Hypothyroidism, unspecified: Secondary | ICD-10-CM | POA: Diagnosis not present

## 2023-05-21 DIAGNOSIS — I10 Essential (primary) hypertension: Secondary | ICD-10-CM

## 2023-05-21 MED ORDER — LEVOTHYROXINE SODIUM 50 MCG PO TABS
50.0000 ug | ORAL_TABLET | Freq: Every day | ORAL | 1 refills | Status: DC
Start: 2023-05-21 — End: 2024-01-04

## 2023-05-21 MED ORDER — VALSARTAN-HYDROCHLOROTHIAZIDE 320-12.5 MG PO TABS: 1.0000 | ORAL_TABLET | Freq: Every day | ORAL | 1 refills | Status: DC

## 2023-05-21 MED ORDER — ROSUVASTATIN CALCIUM 10 MG PO TABS: 10.0000 mg | ORAL_TABLET | Freq: Every day | ORAL | 1 refills | Status: DC

## 2023-05-21 NOTE — Assessment & Plan Note (Signed)
Last lipid panel: LDL 177, HDL 55, triglycerides 211.  Patient admits that she has not been very consistent in taking her rosuvastatin but would like to get back on board.  We will recheck lab work before her appointment in 6 months.

## 2023-05-21 NOTE — Assessment & Plan Note (Signed)
Followed by endocrinology.  Most recent TSH within normal limits.  Continue levothyroxine 50 mcg daily.  Will continue to monitor.

## 2023-05-21 NOTE — Patient Instructions (Addendum)
Take your thyroid medicine and blood pressure medicine every morning before you go for your walk.  Check your blood pressure when you get home after you have had a chance to sit and relax for at least 20 minutes.  I would also recommend checking your blood pressure sometime in the evening.  Come back in 2 weeks for a nurse visit blood pressure check.  Make sure to take your blood pressure medicine before your appointment.

## 2023-05-21 NOTE — Assessment & Plan Note (Addendum)
BP goal <130/80.  Blood pressure above goal 164/77 initially, 148/73 on recheck.  Patient has not taken medication yet today.  Encouraged her to take her medication daily every single morning and check her blood pressure for the next 2 weeks before returning for a nurse visit blood pressure check.  I emphasized that it is important for her to take her medicine that day before coming to her appointment.  Patient verbalized understanding and is agreeable to this plan.  Continue valsartan-hydrochlorothiazide 320-12.5 mg daily unless blood pressure remains elevated at blood pressure check.

## 2023-05-21 NOTE — Progress Notes (Signed)
Established Patient Office Visit  Subjective   Patient ID: Alejandra Alexander, female    DOB: 10/16/1941  Age: 82 y.o. MRN: [id number]  Chief Complaint  Patient presents with   Hypothyroidism    HPI Alejandra Alexander is a 82 y.o. female presenting today for follow up of hypertension, hyperlipidemia, hypothyroidism. Hypertension: Patient here for follow-up of elevated blood pressure. She is exercising by walking for an hour every morning and is adherent to low salt diet.   Pt denies chest pain, SOB, dizziness, edema, syncope, fatigue or heart palpitations. Taking valsartan-HCTZ, reports excellent compliance with treatment. Denies side effects. Hyperlipidemia: tolerating rosuvastatin well with no myalgias or significant side effects.  She does admit that she has not been taking it every single day consistently. The ASCVD Risk score (Arnett DK, et al., 2019) failed to calculate for the following reasons:   The 2019 ASCVD risk score is only valid for ages 30 to 34 Hypothyroidism: Taking levothyroxine 50 mcg regularly in the AM away from food and vitamins. Denies fatigue, weight changes, heat/cold intolerance, skin/hair changes, bowel changes, CVS symptoms.  ROS Negative unless otherwise noted in HPI   Objective:     BP (!) 148/73 (BP Location: Left Arm, Patient Position: Sitting, Cuff Size: Normal)   Pulse 94   Resp 18   SpO2 96%   Physical Exam Constitutional:      General: She is not in acute distress.    Appearance: Normal appearance.  HENT:     Head: Normocephalic and atraumatic.  Cardiovascular:     Rate and Rhythm: Normal rate and regular rhythm.     Heart sounds: No murmur heard.    No friction rub. No gallop.  Pulmonary:     Effort: Pulmonary effort is normal. No respiratory distress.     Breath sounds: No wheezing, rhonchi or rales.  Skin:    General: Skin is warm and dry.  Neurological:     Mental Status: She is alert and oriented to person, place, and time.       Assessment & Plan:  Primary hypertension Assessment & Plan: BP goal <130/80.  Blood pressure above goal 164/77 initially, 148/73 on recheck.  Patient has not taken medication yet today.  Encouraged her to take her medication daily every single morning and check her blood pressure for the next 2 weeks before returning for a nurse visit blood pressure check.  I emphasized that it is important for her to take her medicine that day before coming to her appointment.  Patient verbalized understanding and is agreeable to this plan.  Continue valsartan-hydrochlorothiazide 320-12.5 mg daily unless blood pressure remains elevated at blood pressure check.  Orders: -     Valsartan-hydroCHLOROthiazide; Take 1 tablet by mouth daily.  Dispense: 90 tablet; Refill: 1  Mixed hyperlipidemia Assessment & Plan: Last lipid panel: LDL 177, HDL 55, triglycerides 211.  Patient admits that she has not been very consistent in taking her rosuvastatin but would like to get back on board.  We will recheck lab work before her appointment in 6 months.  Orders: -     Rosuvastatin Calcium; Take 1 tablet (10 mg total) by mouth daily.  Dispense: 90 tablet; Refill: 1  Hypothyroidism, unspecified type Assessment & Plan: Followed by endocrinology.  Most recent TSH within normal limits.  Continue levothyroxine 50 mcg daily.  Will continue to monitor.  Orders: -     Levothyroxine Sodium; Take 1 tablet (50 mcg total) by mouth daily.  Dispense:  90 tablet; Refill: 1    Return in about 2 weeks (around 06/04/2023) for BP check nurse visit; 6 months for follow up for HTN, HLD, thyroid, fasting blood work 1 week before.    Melida Quitter, PA

## 2023-06-04 ENCOUNTER — Ambulatory Visit (INDEPENDENT_AMBULATORY_CARE_PROVIDER_SITE_OTHER): Payer: Medicare Other | Admitting: Family Medicine

## 2023-06-04 VITALS — BP 183/73

## 2023-06-04 DIAGNOSIS — I159 Secondary hypertension, unspecified: Secondary | ICD-10-CM

## 2023-06-04 NOTE — Progress Notes (Signed)
Pt denies CP, SOB, dizziness, or heart palpitations. taking meds as directed without problems. Denies med side effects. 5 min spent with pt.   Patient states she does not take BP medication until 11 am. Patient advised to schedule BP checks at least 30 minutes after she takes her medication.

## 2023-08-27 ENCOUNTER — Encounter: Payer: Self-pay | Admitting: Family Medicine

## 2023-10-02 ENCOUNTER — Ambulatory Visit: Payer: Medicare Other

## 2023-10-10 ENCOUNTER — Ambulatory Visit (INDEPENDENT_AMBULATORY_CARE_PROVIDER_SITE_OTHER): Payer: Medicare Other | Admitting: Family Medicine

## 2023-10-10 VITALS — BP 181/80 | HR 91

## 2023-10-10 DIAGNOSIS — I10 Essential (primary) hypertension: Secondary | ICD-10-CM

## 2023-10-10 NOTE — Progress Notes (Signed)
Pt denies CP, SOB, dizziness, or heart palpitations. taking meds as directed without problems. Denies med side effects. 5 min spent with pt. 

## 2023-10-17 ENCOUNTER — Encounter: Payer: Self-pay | Admitting: Family Medicine

## 2023-10-17 ENCOUNTER — Ambulatory Visit (INDEPENDENT_AMBULATORY_CARE_PROVIDER_SITE_OTHER): Payer: Medicare Other | Admitting: Family Medicine

## 2023-10-17 VITALS — BP 171/76 | HR 95 | Resp 20 | Ht 62.0 in | Wt 132.0 lb

## 2023-10-17 DIAGNOSIS — J302 Other seasonal allergic rhinitis: Secondary | ICD-10-CM | POA: Diagnosis not present

## 2023-10-17 DIAGNOSIS — R0981 Nasal congestion: Secondary | ICD-10-CM | POA: Diagnosis not present

## 2023-10-17 DIAGNOSIS — I10 Essential (primary) hypertension: Secondary | ICD-10-CM

## 2023-10-17 MED ORDER — GUAIFENESIN 200 MG PO TABS
200.0000 mg | ORAL_TABLET | ORAL | 0 refills | Status: AC
Start: 2023-10-17 — End: ?

## 2023-10-17 MED ORDER — VALSARTAN-HYDROCHLOROTHIAZIDE 320-25 MG PO TABS
1.0000 | ORAL_TABLET | Freq: Every day | ORAL | 3 refills | Status: DC
Start: 2023-10-17 — End: 2024-06-18

## 2023-10-17 MED ORDER — CETIRIZINE HCL 5 MG PO TABS
5.0000 mg | ORAL_TABLET | Freq: Every day | ORAL | 3 refills | Status: DC
Start: 2023-10-17 — End: 2024-02-13

## 2023-10-17 NOTE — Patient Instructions (Addendum)
LIMIT SODIUM INTAKE TO 1500 MG DAILY  PREVENTATIVE CARE: The following are recommended for you to keep you healthy! -Bone density scan to screen for osteoporosis -Tetanus booster: Recommended once every 10 years for all adults -Shingles vaccines: 2 doses

## 2023-10-17 NOTE — Assessment & Plan Note (Addendum)
BP goal <130/80.  Blood pressure has been consistently elevated for several months.  Increase valsartan-hydrochlorothiazide to 320-25 mg daily.  Also discussed and provided printed information about DASH diet and reducing sodium intake.  Return in 3 weeks to ensure that change provides adequate blood pressure control.

## 2023-10-17 NOTE — Progress Notes (Signed)
   Acute Office Visit  Subjective:     Patient ID: Alejandra Alexander, female    DOB: 16-Jan-1941, 82 y.o.   MRN: [id number]  Chief Complaint  Patient presents with   Sinusitis         HPI Patient is in today with improved symptoms compared to when she originally scheduled her appointment.  When she scheduled the appointment 9 days ago, she was experiencing ear fullness, sinus pressure, URI symptoms.  Since that time, her ears have opened up and her only complaint at this point is rhinorrhea and nasal congestion.  ROS Negative unless otherwise noted in HPI    Objective:    BP (!) 171/76 (BP Location: Left Arm, Patient Position: Sitting, Cuff Size: Normal)   Pulse 95   Resp 20   Ht 5\' 2"  (1.575 m)   Wt 132 lb (59.9 kg)   SpO2 97%   BMI 24.14 kg/m   Physical Exam Constitutional:      General: She is not in acute distress.    Appearance: Normal appearance. She is not ill-appearing.  HENT:     Head: Normocephalic and atraumatic.     Right Ear: External ear normal. There is impacted cerumen.     Left Ear: External ear normal. There is impacted cerumen.     Nose: Nose normal. No congestion or rhinorrhea.     Mouth/Throat:     Mouth: Mucous membranes are moist.     Pharynx: Oropharynx is clear. No oropharyngeal exudate or posterior oropharyngeal erythema.  Eyes:     General:        Right eye: No discharge.        Left eye: No discharge.     Extraocular Movements: Extraocular movements intact.     Conjunctiva/sclera: Conjunctivae normal.     Pupils: Pupils are equal, round, and reactive to light.  Cardiovascular:     Rate and Rhythm: Normal rate and regular rhythm.     Heart sounds: No murmur heard.    No friction rub. No gallop.  Pulmonary:     Effort: Pulmonary effort is normal. No respiratory distress.     Breath sounds: Normal breath sounds. No wheezing, rhonchi or rales.  Skin:    General: Skin is warm and dry.  Neurological:     Mental Status: She is alert and  oriented to person, place, and time.      Assessment & Plan:  Primary hypertension Assessment & Plan: BP goal <130/80.  Blood pressure has been consistently elevated for several months.  Increase valsartan-hydrochlorothiazide to 320-25 mg daily.  Also discussed and provided printed information about DASH diet and reducing sodium intake.  Return in 3 weeks to ensure that change provides adequate blood pressure control.  Orders: -     Valsartan-hydroCHLOROthiazide; Take 1 tablet by mouth daily.  Dispense: 90 tablet; Refill: 3  Chronic seasonal allergic rhinitis -     Cetirizine HCl; Take 1 tablet (5 mg total) by mouth daily.  Dispense: 90 tablet; Refill: 3  Nasal congestion -     guaiFENesin; Take 1 tablet (200 mg total) by mouth every 4 (four) hours.  Dispense: 30 tablet; Refill: 0  Tympanic membranes not able to be visualized.  No pain with eye movements, only mild maxillary pressure.  More likely to be related to chronic allergic rhinitis for which she is not taking any antihistamines, start cetirizine and short course of guaifenesin.  Return as scheduled 11/21/2023.  Melida Quitter, PA

## 2023-11-06 ENCOUNTER — Other Ambulatory Visit: Payer: Self-pay

## 2023-11-06 DIAGNOSIS — E782 Mixed hyperlipidemia: Secondary | ICD-10-CM

## 2023-11-06 DIAGNOSIS — I10 Essential (primary) hypertension: Secondary | ICD-10-CM

## 2023-11-14 ENCOUNTER — Other Ambulatory Visit: Payer: Medicare Other

## 2023-11-21 ENCOUNTER — Ambulatory Visit: Payer: Medicare Other | Admitting: Family Medicine

## 2023-12-05 ENCOUNTER — Ambulatory Visit (INDEPENDENT_AMBULATORY_CARE_PROVIDER_SITE_OTHER): Payer: Medicare Other | Admitting: Family Medicine

## 2023-12-05 ENCOUNTER — Encounter: Payer: Self-pay | Admitting: Family Medicine

## 2023-12-05 VITALS — BP 160/96 | HR 83 | Temp 98.4°F | Ht 62.0 in | Wt 135.0 lb

## 2023-12-05 DIAGNOSIS — E782 Mixed hyperlipidemia: Secondary | ICD-10-CM | POA: Diagnosis not present

## 2023-12-05 DIAGNOSIS — I10 Essential (primary) hypertension: Secondary | ICD-10-CM

## 2023-12-05 DIAGNOSIS — E039 Hypothyroidism, unspecified: Secondary | ICD-10-CM

## 2023-12-05 NOTE — Patient Instructions (Addendum)
BLOOD PRESSURE LOG: For the next 2 weeks, check your blood pressure twice a day and keep a blood pressure log of the numbers that you get. 1st check: About 1-2 hours after you take your blood pressure medicine valsartan-hydrochlorothiazide in the morning 2nd check: around 6pm Bring the paper copy of your blood pressure log to the front office in 2 weeks.  How to take your blood pressure Avoid these things for 30 minutes before checking your blood pressure: Having drinks with caffeine in them, such as coffee or tea. Drinking alcohol. Eating. Smoking. Exercising. Do these things five minutes before checking your blood pressure: Go to the bathroom and pee (urinate). Sit in a chair. Be quiet. Do not talk. Follow the instructions that came with your machine. If you have a digital blood pressure monitor, these may be the instructions: Sit up straight. Place your feet on the floor. Do not cross your ankles or legs. Rest your left arm at the level of your heart. You may rest it on a table, desk, or chair. Pull up your shirt sleeve. Wrap the blood pressure cuff around the upper part of your left arm. The cuff should be 1 inch (2.5 cm) above your elbow. It is best to wrap the cuff around bare skin. Fit the cuff snugly around your arm, but not too tightly. You should be able to place only one finger between the cuff and your arm. Place the cord so that it rests in the bend of your elbow. Press the power button. Sit quietly while the cuff fills with air and loses air. Write down the numbers on the screen. Wait 2-3 minutes and then repeat steps 1-10.  In the back of this packet, there is more information on the low sodium diet called the DASH eating plan.  GOAL: less than 1500 mg of sodium total each day Make sure you are reading the nutrition labels for all of the foods that you eat and beverages that you drink.  Make sure to drink enough water. GOAL: 64 oz of water daily

## 2023-12-05 NOTE — Assessment & Plan Note (Signed)
Last lipid panel: LDL 177, HDL 55, triglycerides 211.  Recheck lipid panel and CMP.  Continue rosuvastatin 10 mg daily unless lab results warrant change.

## 2023-12-05 NOTE — Assessment & Plan Note (Signed)
BP goal <130/80.  Blood pressure remains elevated even on manual recheck in the office today.  She has not been taking a blood pressure log and states that her blood pressure is "up and down" when she does check it at home.  Continue valsartan-hydrochlorothiazide to 320-25 mg daily each morning, provided blood pressure log and written instructions reviewing how to properly check blood pressure at home.  Instructions specified to check at least twice a day: First 1-2 hours after taking her blood pressure medicine, and a second time in the evening around 6 PM.  Also extensively discussed the meaning of a low-sodium diet, the importance of checking nutrition labels, and the fact that this and staying physically active will have a positive impact on lowering her blood pressure without adjusting medications.  Also provided printed information about DASH diet and reducing sodium intake.  Patient will keep a blood pressure log for 2 weeks and bring a hard copy by the front office.  If blood pressure remains elevated, add amlodipine 5 mg daily.  Will continue to monitor.

## 2023-12-05 NOTE — Assessment & Plan Note (Signed)
Previously followed by endocrinology, most recent appointment was January 2023.  Most recent TSH within normal limits, recheck thyroid labs.  Continue levothyroxine 50 mcg daily.  Will continue to monitor.

## 2023-12-05 NOTE — Progress Notes (Signed)
Established Patient Office Visit  Subjective   Patient ID: Alejandra Alexander, female    DOB: 1941-05-29  Age: 83 y.o. MRN: [id number]  Chief Complaint  Patient presents with   Follow-up    HPI Alejandra Alexander is a 83 y.o. female presenting today for follow up of hypertension, hyperlipidemia, hypothyroidism.  She did not have labs done prior to her appointment today. Hypertension: Patient here for follow-up of elevated blood pressure. She is exercising, walking at least 4 days each week, and is not adherent to low salt diet.  She states that she does not typically add extra salt to her food but she does not check nutrition labels for sodium content.  Pt denies chest pain, SOB, dizziness, edema, syncope, fatigue or heart palpitations. Taking increased dose of valsartan-hydrochlorothiazide 320-25 mg daily, reports excellent compliance with treatment. Denies side effects. Hyperlipidemia: tolerating rosuvastatin 10 mg daily well with no myalgias or significant side effects.  The ASCVD Risk score (Arnett DK, et al., 2019) failed to calculate for the following reasons:   The 2019 ASCVD risk score is only valid for ages 55 to 32 Hypothyroidism: Taking levothyroxine regularly in the AM away from food and vitamins. Denies fatigue, weight changes, heat/cold intolerance, skin/hair changes, bowel changes, CVS symptoms.   Outpatient Medications Prior to Visit  Medication Sig   acetaminophen (TYLENOL) 500 MG tablet Take 1 tablet (500 mg total) by mouth every 6 (six) hours as needed.   cetirizine (ZYRTEC) 5 MG tablet Take 1 tablet (5 mg total) by mouth daily.   Cholecalciferol (VITAMIN D3) 25 MCG (1000 UT) CAPS Take 2 capsules by mouth 2 (two) times daily.   guaiFENesin 200 MG tablet Take 1 tablet (200 mg total) by mouth every 4 (four) hours.   levothyroxine (SYNTHROID) 50 MCG tablet Take 1 tablet (50 mcg total) by mouth daily.   rosuvastatin (CRESTOR) 10 MG tablet Take 1 tablet (10 mg total) by mouth  daily.   valsartan-hydrochlorothiazide (DIOVAN-HCT) 320-25 MG tablet Take 1 tablet by mouth daily.   No facility-administered medications prior to visit.    ROS Negative unless otherwise noted in HPI   Objective:     BP (!) 160/96 (BP Location: Left Arm, Patient Position: Sitting, Cuff Size: Normal) Comment: manual BP  Pulse 83   Temp 98.4 F (36.9 C) (Temporal)   Ht 5\' 2"  (1.575 m)   Wt 135 lb (61.2 kg)   SpO2 95%   BMI 24.69 kg/m   Physical Exam Constitutional:      General: She is not in acute distress.    Appearance: Normal appearance.  HENT:     Head: Normocephalic and atraumatic.  Cardiovascular:     Rate and Rhythm: Normal rate and regular rhythm.     Heart sounds: No murmur heard.    No friction rub. No gallop.  Pulmonary:     Effort: Pulmonary effort is normal. No respiratory distress.     Breath sounds: No wheezing, rhonchi or rales.  Skin:    General: Skin is warm and dry.  Neurological:     Mental Status: She is alert and oriented to person, place, and time. Mental status is at baseline.      Assessment & Plan:  Primary hypertension Assessment & Plan: BP goal <130/80.  Blood pressure remains elevated even on manual recheck in the office today.  She has not been taking a blood pressure log and states that her blood pressure is "up and down" when she does  check it at home.  Continue valsartan-hydrochlorothiazide to 320-25 mg daily each morning, provided blood pressure log and written instructions reviewing how to properly check blood pressure at home.  Instructions specified to check at least twice a day: First 1-2 hours after taking her blood pressure medicine, and a second time in the evening around 6 PM.  Also extensively discussed the meaning of a low-sodium diet, the importance of checking nutrition labels, and the fact that this and staying physically active will have a positive impact on lowering her blood pressure without adjusting medications.  Also  provided printed information about DASH diet and reducing sodium intake.  Patient will keep a blood pressure log for 2 weeks and bring a hard copy by the front office.  If blood pressure remains elevated, add amlodipine 5 mg daily.  Will continue to monitor.  Orders: -     CBC with Differential/Platelet; Future -     Comprehensive metabolic panel; Future  Mixed hyperlipidemia Assessment & Plan: Last lipid panel: LDL 177, HDL 55, triglycerides 211.  Recheck lipid panel and CMP.  Continue rosuvastatin 10 mg daily unless lab results warrant change.  Orders: -     Comprehensive metabolic panel; Future -     Lipid panel; Future  Hypothyroidism, unspecified type Assessment & Plan: Previously followed by endocrinology, most recent appointment was January 2023.  Most recent TSH within normal limits, recheck thyroid labs.  Continue levothyroxine 50 mcg daily.  Will continue to monitor.  Orders: -     TSH; Future -     T4, free; Future  Given other items needed to be addressed, I did not get to discuss preventative care that is overdue including Tdap, shingles, DEXA scan, Medicare AWV.  Return in about 1 day (around 12/06/2023) for fasting blood work; 4 week follow up for HTN.    Melida Quitter, PA

## 2023-12-06 ENCOUNTER — Other Ambulatory Visit: Payer: Medicare Other

## 2023-12-06 DIAGNOSIS — I10 Essential (primary) hypertension: Secondary | ICD-10-CM | POA: Diagnosis not present

## 2023-12-06 DIAGNOSIS — E039 Hypothyroidism, unspecified: Secondary | ICD-10-CM | POA: Diagnosis not present

## 2023-12-06 DIAGNOSIS — E782 Mixed hyperlipidemia: Secondary | ICD-10-CM

## 2023-12-06 DIAGNOSIS — R7301 Impaired fasting glucose: Secondary | ICD-10-CM | POA: Diagnosis not present

## 2023-12-07 ENCOUNTER — Encounter: Payer: Self-pay | Admitting: Family Medicine

## 2023-12-07 LAB — COMPREHENSIVE METABOLIC PANEL
ALT: 17 [IU]/L (ref 0–32)
AST: 22 [IU]/L (ref 0–40)
Albumin: 4.5 g/dL (ref 3.7–4.7)
Alkaline Phosphatase: 104 [IU]/L (ref 44–121)
BUN/Creatinine Ratio: 28 (ref 12–28)
BUN: 24 mg/dL (ref 8–27)
Bilirubin Total: 0.8 mg/dL (ref 0.0–1.2)
CO2: 21 mmol/L (ref 20–29)
Calcium: 9.7 mg/dL (ref 8.7–10.3)
Chloride: 101 mmol/L (ref 96–106)
Creatinine, Ser: 0.87 mg/dL (ref 0.57–1.00)
Globulin, Total: 2.4 g/dL (ref 1.5–4.5)
Glucose: 92 mg/dL (ref 70–99)
Potassium: 4.3 mmol/L (ref 3.5–5.2)
Sodium: 142 mmol/L (ref 134–144)
Total Protein: 6.9 g/dL (ref 6.0–8.5)
eGFR: 66 mL/min/{1.73_m2} (ref >59)

## 2023-12-07 LAB — LIPID PANEL
Chol/HDL Ratio: 4.3 {ratio} (ref 0.0–4.4)
Cholesterol, Total: 250 mg/dL — ABNORMAL HIGH (ref 100–199)
HDL: 58 mg/dL (ref >39)
LDL Chol Calc (NIH): 164 mg/dL — ABNORMAL HIGH (ref 0–99)
Triglycerides: 154 mg/dL — ABNORMAL HIGH (ref 0–149)
VLDL Cholesterol Cal: 28 mg/dL (ref 5–40)

## 2023-12-07 LAB — CBC WITH DIFFERENTIAL/PLATELET
Basophils Absolute: 0 10*3/uL (ref 0.0–0.2)
Basos: 0 %
EOS (ABSOLUTE): 0.2 10*3/uL (ref 0.0–0.4)
Eos: 4 %
Hematocrit: 42.6 % (ref 34.0–46.6)
Hemoglobin: 14.2 g/dL (ref 11.1–15.9)
Immature Grans (Abs): 0 10*3/uL (ref 0.0–0.1)
Immature Granulocytes: 0 %
Lymphocytes Absolute: 1.8 10*3/uL (ref 0.7–3.1)
Lymphs: 29 %
MCH: 29.6 pg (ref 26.6–33.0)
MCHC: 33.3 g/dL (ref 31.5–35.7)
MCV: 89 fL (ref 79–97)
Monocytes Absolute: 0.5 10*3/uL (ref 0.1–0.9)
Monocytes: 8 %
Neutrophils Absolute: 3.6 10*3/uL (ref 1.4–7.0)
Neutrophils: 59 %
Platelets: 270 10*3/uL (ref 150–450)
RBC: 4.79 x10E6/uL (ref 3.77–5.28)
RDW: 13.1 % (ref 11.7–15.4)
WBC: 6.1 10*3/uL (ref 3.4–10.8)

## 2023-12-07 LAB — T4, FREE: Free T4: 0.96 ng/dL (ref 0.82–1.77)

## 2023-12-07 LAB — HEMOGLOBIN A1C
Est. average glucose Bld gHb Est-mCnc: 123 mg/dL
Hgb A1c MFr Bld: 5.9 % — ABNORMAL HIGH (ref 4.8–5.6)

## 2023-12-07 LAB — TSH: TSH: 7.38 u[IU]/mL — ABNORMAL HIGH (ref 0.450–4.500)

## 2023-12-13 ENCOUNTER — Encounter: Payer: Self-pay | Admitting: Family Medicine

## 2023-12-19 ENCOUNTER — Encounter: Payer: Self-pay | Admitting: Family Medicine

## 2024-01-01 ENCOUNTER — Ambulatory Visit (INDEPENDENT_AMBULATORY_CARE_PROVIDER_SITE_OTHER): Payer: Medicare Other | Admitting: Family Medicine

## 2024-01-01 ENCOUNTER — Telehealth: Payer: Self-pay | Admitting: Family Medicine

## 2024-01-01 ENCOUNTER — Encounter: Payer: Self-pay | Admitting: Family Medicine

## 2024-01-01 VITALS — BP 142/76 | HR 88 | Ht 62.0 in | Wt 134.8 lb

## 2024-01-01 DIAGNOSIS — H6121 Impacted cerumen, right ear: Secondary | ICD-10-CM

## 2024-01-01 DIAGNOSIS — H60501 Unspecified acute noninfective otitis externa, right ear: Secondary | ICD-10-CM | POA: Diagnosis not present

## 2024-01-01 MED ORDER — CIPROFLOXACIN-DEXAMETHASONE 0.3-0.1 % OT SUSP: 4.0000 [drp] | Freq: Two times a day (BID) | OTIC | 0 refills | Status: AC

## 2024-01-01 MED ORDER — CIPROFLOXACIN-HYDROCORTISONE 0.2-1 % OT SUSP: 3.0000 [drp] | Freq: Two times a day (BID) | OTIC | 0 refills | Status: DC

## 2024-01-01 NOTE — Progress Notes (Signed)
 Acute Office Visit  Subjective:     Patient ID: Alejandra Alexander, female    DOB: 02-06-1941, 83 y.o.   MRN: [id number]  Chief Complaint  Patient presents with   Ear Pain    HPI Patient is in today for left ear ache. Patient complains of right ear pain and congestion.  Onset of symptoms was 1 day ago, unchanged since that time. She states there is no hearing loss and no drainage.  She does not have a history of swimming.  She has tried no medications  for his symptoms.  Pain is minor, she states she "wanted to catch this early."  She has taken aspirin occasionally which has improved her ear pain.  ROS See HPI    Objective:    BP (!) 142/76   Pulse 88   Ht 5\' 2"  (1.575 m)   Wt 134 lb 12 oz (61.1 kg)   SpO2 97%   BMI 24.65 kg/m   Physical Exam Constitutional:      General: She is not in acute distress.    Appearance: Normal appearance. She is not ill-appearing.  HENT:     Head: Normocephalic and atraumatic.     Right Ear: External ear normal. There is impacted cerumen.     Left Ear: Tympanic membrane, ear canal and external ear normal.     Nose: Nose normal. No congestion or rhinorrhea.     Mouth/Throat:     Mouth: Mucous membranes are moist.     Pharynx: Oropharynx is clear. No oropharyngeal exudate or posterior oropharyngeal erythema.  Eyes:     General:        Right eye: No discharge.        Left eye: No discharge.     Conjunctiva/sclera: Conjunctivae normal.     Pupils: Pupils are equal, round, and reactive to light.  Cardiovascular:     Rate and Rhythm: Normal rate and regular rhythm.     Heart sounds: No murmur heard.    No friction rub. No gallop.  Pulmonary:     Effort: Pulmonary effort is normal. No respiratory distress.     Breath sounds: Normal breath sounds. No wheezing, rhonchi or rales.  Skin:    General: Skin is warm and dry.  Neurological:     Mental Status: She is alert and oriented to person, place, and time.   Ear Cerumen  Removal  Date/Time: 01/01/2024 10:13 AM  Performed by: Melida Quitter, PA Authorized by: Melida Quitter, PA   Anesthesia: Local Anesthetic: none Location details: right ear Patient tolerance: patient tolerated the procedure well with no immediate complications Comments: Small amount of cerumen removed via curette.  On reinspection, TM and external auditory canal were still not able to be visualized.  Decision was made to proceed with irrigation. Procedure type: curette   Ear Cerumen Removal  Date/Time: 01/01/2024 10:14 AM  Performed by: Melida Quitter, PA Authorized by: Melida Quitter, PA   Anesthesia: Local Anesthetic: none Location details: right ear Patient tolerance: patient tolerated the procedure well with no immediate complications Comments: Again, small amount of cerumen removed via irrigation.  On reinspection, TM was still not able to be visualized.  External auditory canal did appear erythematous. Procedure type: irrigation       Assessment & Plan:  Acute otitis externa of right ear, unspecified type -     Ciprofloxacin-Hydrocortisone; Place 3 drops into the right ear 2 (two) times daily for 7 days.  Dispense:  10 mL; Refill: 0  Other orders -     Ear Cerumen Removal -     Ear Cerumen Removal  Unable to visualize TM to determine presence of acute otitis media given excessive cerumen even with curette and irrigation attempts.  Start with ciprofloxacin-hydrocortisone otic drops for 7 days.  Patient has a routine follow-up appointment tomorrow already scheduled, can reinspect right ear at that time to see if any cerumen has loosened.  Return in about 1 day (around 01/02/2024) for follow-up as scheduled.  Melida Quitter, PA

## 2024-01-01 NOTE — Telephone Encounter (Signed)
 Change prescription to ciprofloxacin-dexamethasone, this should be more affordable.

## 2024-01-01 NOTE — Telephone Encounter (Signed)
 Please see note below. Routing to provider for alternatives.  Copied from CRM [id number]. Topic: Clinical - Prescription Issue >> Jan 01, 2024 12:08 PM Everette C wrote: Reason for CRM: Judeth Cornfield with [facility] has called about the patient's prescription for ciprofloxacin-hydrocortisone (CIPRO HC OTIC) OTIC suspension [[id number]]  Judeth Cornfield shares that the medication will cost the patient $355 and would like to discuss cost effective alternatives for the patient   Please contact when possible

## 2024-01-01 NOTE — Addendum Note (Signed)
 Addended by: Saralyn Pilar on: 01/01/2024 02:44 PM   Modules accepted: Orders

## 2024-01-01 NOTE — Telephone Encounter (Signed)
 Contacted pharmacy and she said that the generic of the drops went in would be right at $100.00, she said the generic cortosporin octic would be $51.00.  that would be the cheapest option

## 2024-01-01 NOTE — Patient Instructions (Signed)
 Use the eardrops twice a day to treat any infection and help with pain.  Take Tylenol for any ear pain not improved with the eardrops. You can take Tylenol 500 mg every 4-6 hours as needed.

## 2024-01-02 ENCOUNTER — Encounter: Payer: Self-pay | Admitting: Family Medicine

## 2024-01-02 ENCOUNTER — Ambulatory Visit (INDEPENDENT_AMBULATORY_CARE_PROVIDER_SITE_OTHER): Payer: Medicare Other | Admitting: Family Medicine

## 2024-01-02 VITALS — BP 144/68 | HR 93 | Ht 62.0 in | Wt 134.8 lb

## 2024-01-02 DIAGNOSIS — I10 Essential (primary) hypertension: Secondary | ICD-10-CM | POA: Diagnosis not present

## 2024-01-02 DIAGNOSIS — E039 Hypothyroidism, unspecified: Secondary | ICD-10-CM | POA: Diagnosis not present

## 2024-01-02 MED ORDER — AMLODIPINE BESYLATE 5 MG PO TABS: 5.0000 mg | ORAL_TABLET | Freq: Every day | ORAL | 3 refills | Status: DC

## 2024-01-02 NOTE — Patient Instructions (Addendum)
 ADD amlodipine for your blood pressure. You can take it at the same time as your other blood pressure medicine (valsartan-hydrochlorothiazide). Check your blood pressure at home and keep a log.  The goal is to keep blood pressure under 130/80.  We will check your thyroid lab and then adjust your thyroid medicine according to the results.  At the pharmacy, please update your tetanus and shingles vaccines.

## 2024-01-02 NOTE — Assessment & Plan Note (Signed)
 BP goal <130/80 and daily.  Blood pressure has consistently been above even 140/80. Continue valsartan-hydrochlorothiazide to 320-25 mg daily each morning, add amlodipine 5 mg daily each morning.  Provided instructions on how to accurately take blood pressure measurement at home, encouraged her to continue keeping an ambulatory blood pressure log.  Will continue to monitor.

## 2024-01-02 NOTE — Assessment & Plan Note (Signed)
 PCP taking over hypothyroidism management.  Rechecking thyroid labs.  If TSH remains elevated, increase levothyroxine dose.

## 2024-01-02 NOTE — Progress Notes (Signed)
 Established Patient Office Visit  Subjective   Patient ID: Alejandra Alexander, female    DOB: 1941/01/22  Age: 83 y.o. MRN: [id number]  Chief Complaint  Patient presents with   Hypertension    HPI Alejandra Alexander is a 83 y.o. female presenting today for follow up of hypertension. She  is not adherent to low salt diet.   Pt denies chest pain, SOB, dizziness, edema, syncope, fatigue or heart palpitations. Taking valsartan-hydrochlorothiazide around 10 AM daily, reports excellent compliance with treatment. Denies side effects.  She brought a blood pressure log from home, ranges between 136 on one reading to 152 systolic on another reading with average systolic around 146-148.  Outpatient Medications Prior to Visit  Medication Sig   acetaminophen (TYLENOL) 500 MG tablet Take 1 tablet (500 mg total) by mouth every 6 (six) hours as needed.   cetirizine (ZYRTEC) 5 MG tablet Take 1 tablet (5 mg total) by mouth daily.   Cholecalciferol (VITAMIN D3) 25 MCG (1000 UT) CAPS Take 2 capsules by mouth 2 (two) times daily.   ciprofloxacin-dexamethasone (CIPRODEX) OTIC suspension Place 4 drops into the right ear 2 (two) times daily for 7 days.   guaiFENesin 200 MG tablet Take 1 tablet (200 mg total) by mouth every 4 (four) hours.   levothyroxine (SYNTHROID) 50 MCG tablet Take 1 tablet (50 mcg total) by mouth daily.   rosuvastatin (CRESTOR) 10 MG tablet Take 1 tablet (10 mg total) by mouth daily.   valsartan-hydrochlorothiazide (DIOVAN-HCT) 320-25 MG tablet Take 1 tablet by mouth daily.   No facility-administered medications prior to visit.    ROS Negative unless otherwise noted in HPI   Objective:     BP (!) 144/68   Pulse 93   Ht 5\' 2"  (1.575 m)   Wt 134 lb 12 oz (61.1 kg)   SpO2 96%   BMI 24.65 kg/m   Physical Exam Constitutional:      General: She is not in acute distress.    Appearance: Normal appearance.  HENT:     Head: Normocephalic and atraumatic.  Cardiovascular:     Rate and  Rhythm: Normal rate and regular rhythm.     Heart sounds: No murmur heard.    No friction rub. No gallop.  Pulmonary:     Effort: Pulmonary effort is normal. No respiratory distress.     Breath sounds: No wheezing, rhonchi or rales.  Skin:    General: Skin is warm and dry.  Neurological:     Mental Status: She is alert and oriented to person, place, and time.      Assessment & Plan:  Primary hypertension Assessment & Plan: BP goal <130/80 and daily.  Blood pressure has consistently been above even 140/80. Continue valsartan-hydrochlorothiazide to 320-25 mg daily each morning, add amlodipine 5 mg daily each morning.  Provided instructions on how to accurately take blood pressure measurement at home, encouraged her to continue keeping an ambulatory blood pressure log.  Will continue to monitor.  Orders: -     amLODIPine Besylate; Take 1 tablet (5 mg total) by mouth daily.  Dispense: 30 tablet; Refill: 3  Hypothyroidism, unspecified type Assessment & Plan: PCP taking over hypothyroidism management.  Rechecking thyroid labs.  If TSH remains elevated, increase levothyroxine dose.  Orders: -     TSH; Future -     T4, free; Future    Return in about 1 day (around 01/03/2024) for nonfasting blood work (thyroid); 2 month follow up for HTN, thyroid.  Melida Quitter, PA

## 2024-01-03 ENCOUNTER — Other Ambulatory Visit: Payer: Medicare Other

## 2024-01-03 DIAGNOSIS — E039 Hypothyroidism, unspecified: Secondary | ICD-10-CM | POA: Diagnosis not present

## 2024-01-04 ENCOUNTER — Encounter: Payer: Self-pay | Admitting: Family Medicine

## 2024-01-04 ENCOUNTER — Other Ambulatory Visit: Payer: Self-pay | Admitting: Family Medicine

## 2024-01-04 DIAGNOSIS — E039 Hypothyroidism, unspecified: Secondary | ICD-10-CM

## 2024-01-04 LAB — TSH: TSH: 3.11 u[IU]/mL (ref 0.450–4.500)

## 2024-01-04 LAB — T4, FREE: Free T4: 1.1 ng/dL (ref 0.82–1.77)

## 2024-01-04 MED ORDER — LEVOTHYROXINE SODIUM 50 MCG PO TABS: 50.0000 ug | ORAL_TABLET | Freq: Every day | ORAL | 1 refills | Status: DC

## 2024-02-13 ENCOUNTER — Encounter: Payer: Self-pay | Admitting: Family Medicine

## 2024-02-13 ENCOUNTER — Ambulatory Visit: Admitting: Family Medicine

## 2024-02-13 VITALS — BP 166/67 | HR 81 | Ht 62.0 in | Wt 136.1 lb

## 2024-02-13 DIAGNOSIS — J302 Other seasonal allergic rhinitis: Secondary | ICD-10-CM | POA: Diagnosis not present

## 2024-02-13 MED ORDER — AZELASTINE HCL 0.1 % NA SOLN: 2.0000 | Freq: Two times a day (BID) | NASAL | 12 refills | Status: AC

## 2024-02-13 MED ORDER — CETIRIZINE HCL 10 MG PO TABS: 10.0000 mg | ORAL_TABLET | Freq: Every day | ORAL | 11 refills | Status: AC

## 2024-02-13 MED ORDER — FLUTICASONE PROPIONATE 50 MCG/ACT NA SUSP
2.0000 | Freq: Every day | NASAL | 6 refills | Status: AC
Start: 2024-02-13 — End: ?

## 2024-02-13 NOTE — Progress Notes (Unsigned)
   Acute Office Visit  Subjective:     Patient ID: Alejandra Alexander, female    DOB: 09-20-1941, 83 y.o.   MRN: [id number]  No chief complaint on file.   HPI Patient is in today for ear pain since Monday.    Sneezing, runny nose, congestion.    ROS      Objective:    There were no vitals taken for this visit. {Vitals History (Optional):23777}  Physical Exam  No results found for any visits on 02/13/24.      Assessment & Plan:   There are no diagnoses linked to this encounter.   No follow-ups on file.  Sandre Kitty, MD

## 2024-02-13 NOTE — Patient Instructions (Addendum)
 It was nice to see you today,  We addressed the following topics today: For your allergies I want you to do the following: 1.  Take Zyrtec orally once a day 2.  Spray Flonase, 2 sprays into each nostril once a day 3.  Spray azelastine spray 2 sprays into each nostril twice a day  If this does not help after a week of using this let us know  Have a great day,  Frederic Jericho, MD

## 2024-02-15 ENCOUNTER — Telehealth: Payer: Self-pay | Admitting: *Deleted

## 2024-02-15 NOTE — Assessment & Plan Note (Signed)
 Patient describing symptoms of seasonal allergies which she has been diagnosed with in the past.  Prescribing second-generation oral antihistamine, antihistamine nasal spray and corticosteroid nasal spray.  All available over-the-counter but prescribed for patient for her convenience.  Also provided patient pictures of these in case she needs to find them over-the-counter

## 2024-02-15 NOTE — Telephone Encounter (Signed)
 LVM for pt to call office wanting to see about changing her appointment to a different day.  If she calls please put her through to the office so we can discuss this with her.

## 2024-03-05 ENCOUNTER — Ambulatory Visit: Payer: Medicare Other | Admitting: Family Medicine

## 2024-03-07 ENCOUNTER — Encounter: Payer: Self-pay | Admitting: Family Medicine

## 2024-03-07 ENCOUNTER — Ambulatory Visit (INDEPENDENT_AMBULATORY_CARE_PROVIDER_SITE_OTHER): Admitting: Family Medicine

## 2024-03-07 DIAGNOSIS — E782 Mixed hyperlipidemia: Secondary | ICD-10-CM | POA: Diagnosis not present

## 2024-03-07 DIAGNOSIS — I10 Essential (primary) hypertension: Secondary | ICD-10-CM

## 2024-03-07 MED ORDER — AMLODIPINE BESYLATE 5 MG PO TABS: 5.0000 mg | ORAL_TABLET | Freq: Every day | ORAL | 3 refills | Status: DC

## 2024-03-07 NOTE — Patient Instructions (Signed)
 It was nice to see you today,  We addressed the following topics today: - I am sending in your amlodipine .  This is for blood pressure. Please take this medication every morning.     Have a great day,  Etha Henle, MD

## 2024-03-07 NOTE — Progress Notes (Unsigned)
   Established Patient Office Visit  Subjective   Patient ID: Alejandra Alexander, female    DOB: [birth year]  Age: 83 y.o. MRN: [id number]  Chief Complaint  Patient presents with   Medical Management of Chronic Issues    HPI   Subjective: - Follow-up for blood pressure and thyroid  - Reports BP fluctuates: yesterday 117, this morning 151, usually around 147 - States not taking amlodipine  that was prescribed in 12/2023 - Reports not using salt for years - Denies symptoms  Review of Systems: - Constitutional symptoms: Denies - Eyes: Denies - Ears, Nose, Mouth, Throat: Denies - Cardiovascular: Elevated BP, denies other symptoms - Respiratory: Denies - Gastrointestinal: Denies - Genitourinary: Denies - Musculoskeletal: Denies - Integumentary (Skin): Denies - Neurological: Denies - Psychiatric: Denies - Endocrine: Denies - Hematologic/Lymphatic: Denies - Allergic/Immunologic: Denies  The ASCVD Risk score (Arnett DK, et al., 2019) failed to calculate for the following reasons:   The 2019 ASCVD risk score is only valid for ages 64 to 52  Health Maintenance Due  Topic Date Due   DTaP/Tdap/Td (1 - Tdap) Never done   Zoster Vaccines- Shingrix (1 of 2) 08/30/1990   DEXA SCAN  Never done   COVID-19 Vaccine (6 - 2024-25 season) 07/08/2023   Medicare Annual Wellness (AWV)  11/21/2023      Objective:     BP (!) 162/71   Pulse 84   Ht 5\' 2"  (1.575 m)   Wt 137 lb 1.9 oz (62.2 kg)   SpO2 96%   BMI 25.08 kg/m  {Vitals History (Optional):23777}  Physical Exam   No results found for any visits on 03/07/24.      Assessment & Plan:   There are no diagnoses linked to this encounter.   No follow-ups on file.    Laneta Pintos, MD

## 2024-03-08 LAB — LIPID PANEL
Chol/HDL Ratio: 5.1 ratio — ABNORMAL HIGH (ref 0.0–4.4)
Cholesterol, Total: 251 mg/dL — ABNORMAL HIGH (ref 100–199)
HDL: 49 mg/dL (ref >39)
LDL Chol Calc (NIH): 152 mg/dL — ABNORMAL HIGH (ref 0–99)
Triglycerides: 272 mg/dL — ABNORMAL HIGH (ref 0–149)
VLDL Cholesterol Cal: 50 mg/dL — ABNORMAL HIGH (ref 5–40)

## 2024-03-09 NOTE — Assessment & Plan Note (Signed)
 Rechecking lipid panel today.  On Crestor  10 mg.  Increase as needed.

## 2024-03-09 NOTE — Assessment & Plan Note (Signed)
 Taking valsartan -hydrochlorothiazide .  Amlodipine  was added at last visit.  Patient denies picking up this prescription.  She is unaware she was supposed to be taking it.  Resent it and advised her to take this medication daily with her additional blood pressure medication.

## 2024-03-10 ENCOUNTER — Encounter: Payer: Self-pay | Admitting: Family Medicine

## 2024-03-10 ENCOUNTER — Other Ambulatory Visit: Payer: Self-pay | Admitting: Family Medicine

## 2024-03-10 DIAGNOSIS — E782 Mixed hyperlipidemia: Secondary | ICD-10-CM

## 2024-03-10 MED ORDER — ROSUVASTATIN CALCIUM 20 MG PO TABS: 20.0000 mg | ORAL_TABLET | Freq: Every day | ORAL | 3 refills | Status: DC

## 2024-06-18 ENCOUNTER — Telehealth: Payer: Self-pay

## 2024-06-18 ENCOUNTER — Other Ambulatory Visit: Payer: Self-pay

## 2024-06-18 ENCOUNTER — Other Ambulatory Visit: Payer: Self-pay | Admitting: Family Medicine

## 2024-06-18 DIAGNOSIS — I10 Essential (primary) hypertension: Secondary | ICD-10-CM

## 2024-06-18 MED ORDER — VALSARTAN-HYDROCHLOROTHIAZIDE 320-25 MG PO TABS: 1.0000 | ORAL_TABLET | Freq: Every day | ORAL | 3 refills | Status: AC

## 2024-06-18 NOTE — Telephone Encounter (Signed)
 Pharmacy has been contacted with the correct dosage.

## 2024-06-18 NOTE — Telephone Encounter (Signed)
 Copied from CRM [id number]. Topic: Clinical - Prescription Issue >> Jun 18, 2024  2:58 PM Tobias CROME wrote: Reason for CRM: Corean with [facility] calling to confirm what dosage of the valsartan  htz the patient is supposed to be on. Pharmacy received two prescriptions one for 320-12.5mg  tablets and one for 320-25mg  tablets  Requesting callback: [phone]  Called the pharmacy and verified the correct dosage.

## 2024-06-19 ENCOUNTER — Ambulatory Visit (INDEPENDENT_AMBULATORY_CARE_PROVIDER_SITE_OTHER)

## 2024-06-19 VITALS — BP 182/71 | HR 76 | Ht 62.0 in | Wt 136.0 lb

## 2024-06-19 DIAGNOSIS — E039 Hypothyroidism, unspecified: Secondary | ICD-10-CM | POA: Diagnosis not present

## 2024-06-19 DIAGNOSIS — I10 Essential (primary) hypertension: Secondary | ICD-10-CM

## 2024-06-19 DIAGNOSIS — E782 Mixed hyperlipidemia: Secondary | ICD-10-CM | POA: Diagnosis not present

## 2024-06-19 MED ORDER — AMLODIPINE BESYLATE 10 MG PO TABS: 10.0000 mg | ORAL_TABLET | Freq: Every day | ORAL | 2 refills | Status: AC

## 2024-06-19 NOTE — Assessment & Plan Note (Signed)
 Most recent TSH WNL. Will recheck again in December. Continue levothyroxine  50 mcg tablet daily.

## 2024-06-19 NOTE — Progress Notes (Signed)
 Established Patient Office Visit  Subjective   Patient ID: Alejandra Alexander, female    DOB: April 19, 1941  Age: 83 y.o. MRN: [id number]  No chief complaint on file.   HPI  History of Present Illness   Alejandra Alexander is an 83 year old female with hypertension and hyperlipidemia who presents for medication management.  Hypertension - Typical systolic blood pressure is 145 mmHg at home - Takes antihypertensive medications as prescribed - Currently taking amlodipine  and valsartan -hydrochlorothiazide  - Has one tablet of amlodipine  remaining and plans to refill prescription today  Hyperlipidemia - LDL cholesterol was elevated during a check in May - Currently taking rosuvastatin , which was increased from 10 mg to 20 mg at last visit -Tolerating the medication well and denies side effects  Thyroid  medication management - Received levothyroxine  from the pharmacy yesterday - Tolerating medication well and denies side effects - No need for additional refills at this time        ROS Per HPI.    Objective:     BP (!) 182/71 (BP Location: Right Arm, Patient Position: Sitting, Cuff Size: Normal)   Pulse 76   Ht 5' 2 (1.575 m)   Wt 136 lb (61.7 kg)   SpO2 98%   BMI 24.87 kg/m    Physical Exam Constitutional:      General: She is not in acute distress.    Appearance: Normal appearance.  Cardiovascular:     Rate and Rhythm: Normal rate and regular rhythm.     Heart sounds: Normal heart sounds. No murmur heard.    No friction rub. No gallop.  Pulmonary:     Effort: Pulmonary effort is normal. No respiratory distress.     Breath sounds: Normal breath sounds.  Musculoskeletal:        General: No swelling.  Skin:    General: Skin is warm and dry.  Neurological:     General: No focal deficit present.     Mental Status: She is alert.  Psychiatric:        Mood and Affect: Mood normal.        Behavior: Behavior normal.        Thought Content: Thought content normal.     No results found for any visits on 06/19/24.  Last lipids Lab Results  Component Value Date   CHOL 251 (H) 03/07/2024   HDL 49 03/07/2024   LDLCALC 152 (H) 03/07/2024   TRIG 272 (H) 03/07/2024   CHOLHDL 5.1 (H) 03/07/2024      The ASCVD Risk score (Arnett DK, et al., 2019) failed to calculate for the following reasons:   The 2019 ASCVD risk score is only valid for ages 50 to 64    Assessment & Plan:   Primary hypertension Assessment & Plan: BP goal < 130/80. Elevated blood pressure despite current medication. - Increase amlodipine  from 5 mg to 10 mg daily. - Instruct to take two 5 mg tablets of amlodipine  until new dosage available. - Send prescription for increased amlodipine  to pharmacy. - Continue valsartan -HCTZ as prescribed. Refill sent.    Mixed hyperlipidemia Assessment & Plan: Last lipid panel: LDL 152, HDL 49, Trig 272. At her last office visit in May, we increased her rosuvastatin  from 5 mg to 10 mg. She reports that she is taking this medication as prescribed and denies side effects. Will continue this dose for now. Will plan to recheck lipid panel in December.   Hypothyroidism, unspecified type Assessment & Plan: Most recent TSH  WNL. Will recheck again in December. Continue levothyroxine  50 mcg tablet daily.   Other orders -     amLODIPine  Besylate; Take 1 tablet (10 mg total) by mouth daily.  Dispense: 90 tablet; Refill: 2    Return in about 4 months (around 10/19/2024) for HTN, HLD.    Saddie JULIANNA Sacks, PA-C

## 2024-06-19 NOTE — Assessment & Plan Note (Signed)
 Last lipid panel: LDL 152, HDL 49, Trig 272. At her last office visit in May, we increased her rosuvastatin  from 5 mg to 10 mg. She reports that she is taking this medication as prescribed and denies side effects. Will continue this dose for now. Will plan to recheck lipid panel in December.

## 2024-06-19 NOTE — Assessment & Plan Note (Signed)
 BP goal < 130/80. Elevated blood pressure despite current medication. - Increase amlodipine  from 5 mg to 10 mg daily. - Instruct to take two 5 mg tablets of amlodipine  until new dosage available. - Send prescription for increased amlodipine  to pharmacy. - Continue valsartan -HCTZ as prescribed. Refill sent.

## 2024-06-19 NOTE — Patient Instructions (Signed)
 VISIT SUMMARY: Today, we discussed your hypertension and hyperlipidemia management. We reviewed your current medications and made some adjustments to better control your blood pressure.  YOUR PLAN: -HYPERTENSION: Hypertension means high blood pressure. Your blood pressure is still elevated despite your current medication. We are increasing your amlodipine  dose to 10 mg daily. In the meantime, take two 5 mg tablets of amlodipine  until the new dosage is available. Continue taking valsartan  as prescribed. Please recheck your blood pressure before you leave today.  -HYPERLIPIDEMIA: Hyperlipidemia means high levels of fats (like cholesterol) in your blood. Your cholesterol is being managed with rosuvastatin , which was increased to 20 mg at your last visit. We will recheck your cholesterol levels in a couple of months.  INSTRUCTIONS: Please recheck your blood pressure before you leave today. Recheck your cholesterol levels in a couple of months.  If you have any problems before your next visit feel free to message me via MyChart (minor issues or questions) or call the office, otherwise you may reach out to schedule an office visit.  Thank you! Saddie Sacks, PA-C

## 2024-10-09 ENCOUNTER — Other Ambulatory Visit: Payer: Self-pay

## 2024-10-09 DIAGNOSIS — E039 Hypothyroidism, unspecified: Secondary | ICD-10-CM

## 2024-10-09 DIAGNOSIS — E782 Mixed hyperlipidemia: Secondary | ICD-10-CM

## 2024-10-09 DIAGNOSIS — I10 Essential (primary) hypertension: Secondary | ICD-10-CM

## 2024-10-09 DIAGNOSIS — Z130 Encounter for screening for diseases of the blood and blood-forming organs and certain disorders involving the immune mechanism: Secondary | ICD-10-CM

## 2024-10-13 ENCOUNTER — Other Ambulatory Visit

## 2024-10-13 DIAGNOSIS — E782 Mixed hyperlipidemia: Secondary | ICD-10-CM

## 2024-10-13 DIAGNOSIS — I10 Essential (primary) hypertension: Secondary | ICD-10-CM

## 2024-10-13 DIAGNOSIS — Z130 Encounter for screening for diseases of the blood and blood-forming organs and certain disorders involving the immune mechanism: Secondary | ICD-10-CM

## 2024-10-14 ENCOUNTER — Ambulatory Visit: Payer: Self-pay

## 2024-10-14 LAB — CBC WITH DIFFERENTIAL/PLATELET
Basophils Absolute: 0 x10E3/uL (ref 0.0–0.2)
Basos: 1 %
EOS (ABSOLUTE): 0.3 x10E3/uL (ref 0.0–0.4)
Eos: 4 %
Hematocrit: 44.1 % (ref 34.0–46.6)
Hemoglobin: 14.5 g/dL (ref 11.1–15.9)
Immature Grans (Abs): 0 x10E3/uL (ref 0.0–0.1)
Immature Granulocytes: 0 %
Lymphocytes Absolute: 1.8 x10E3/uL (ref 0.7–3.1)
Lymphs: 30 %
MCH: 30 pg (ref 26.6–33.0)
MCHC: 32.9 g/dL (ref 31.5–35.7)
MCV: 91 fL (ref 79–97)
Monocytes Absolute: 0.5 x10E3/uL (ref 0.1–0.9)
Monocytes: 9 %
Neutrophils Absolute: 3.4 x10E3/uL (ref 1.4–7.0)
Neutrophils: 56 %
Platelets: 285 x10E3/uL (ref 150–450)
RBC: 4.83 x10E6/uL (ref 3.77–5.28)
RDW: 13.1 % (ref 11.7–15.4)
WBC: 6 x10E3/uL (ref 3.4–10.8)

## 2024-10-14 LAB — COMPREHENSIVE METABOLIC PANEL WITH GFR
ALT: 14 IU/L (ref 0–32)
AST: 16 IU/L (ref 0–40)
Albumin: 4.6 g/dL (ref 3.7–4.7)
Alkaline Phosphatase: 103 IU/L (ref 48–129)
BUN/Creatinine Ratio: 17 (ref 12–28)
BUN: 17 mg/dL (ref 8–27)
Bilirubin Total: 0.7 mg/dL (ref 0.0–1.2)
CO2: 20 mmol/L (ref 20–29)
Calcium: 10 mg/dL (ref 8.7–10.3)
Chloride: 105 mmol/L (ref 96–106)
Creatinine, Ser: 1 mg/dL (ref 0.57–1.00)
Globulin, Total: 2.4 g/dL (ref 1.5–4.5)
Glucose: 103 mg/dL — ABNORMAL HIGH (ref 70–99)
Potassium: 4.2 mmol/L (ref 3.5–5.2)
Sodium: 142 mmol/L (ref 134–144)
Total Protein: 7 g/dL (ref 6.0–8.5)
eGFR: 56 mL/min/1.73 — ABNORMAL LOW (ref >59)

## 2024-10-14 LAB — THYROID PANEL WITH TSH
Free Thyroxine Index: 1.3 (ref 1.2–4.9)
T3 Uptake Ratio: 21 % — ABNORMAL LOW (ref 24–39)
T4, Total: 6 ug/dL (ref 4.5–12.0)
TSH: 7.44 u[IU]/mL — ABNORMAL HIGH (ref 0.450–4.500)

## 2024-10-14 LAB — LIPID PANEL
Chol/HDL Ratio: 5 ratio — ABNORMAL HIGH (ref 0.0–4.4)
Cholesterol, Total: 266 mg/dL — ABNORMAL HIGH (ref 100–199)
HDL: 53 mg/dL (ref >39)
LDL Chol Calc (NIH): 187 mg/dL — ABNORMAL HIGH (ref 0–99)
Triglycerides: 143 mg/dL (ref 0–149)
VLDL Cholesterol Cal: 26 mg/dL (ref 5–40)

## 2024-10-14 LAB — HEMOGLOBIN A1C
Est. average glucose Bld gHb Est-mCnc: 120 mg/dL
Hgb A1c MFr Bld: 5.8 % — ABNORMAL HIGH (ref 4.8–5.6)

## 2024-10-14 LAB — VITAMIN D 25 HYDROXY (VIT D DEFICIENCY, FRACTURES): Vit D, 25-Hydroxy: 22 ng/mL — ABNORMAL LOW (ref 30.0–100.0)

## 2024-10-20 ENCOUNTER — Ambulatory Visit (INDEPENDENT_AMBULATORY_CARE_PROVIDER_SITE_OTHER)

## 2024-10-20 VITALS — BP 130/90 | HR 83 | Temp 97.7°F | Ht 62.0 in | Wt 135.1 lb

## 2024-10-20 DIAGNOSIS — N1831 Chronic kidney disease, stage 3a: Secondary | ICD-10-CM | POA: Diagnosis not present

## 2024-10-20 DIAGNOSIS — E559 Vitamin D deficiency, unspecified: Secondary | ICD-10-CM

## 2024-10-20 DIAGNOSIS — E782 Mixed hyperlipidemia: Secondary | ICD-10-CM | POA: Diagnosis not present

## 2024-10-20 DIAGNOSIS — E039 Hypothyroidism, unspecified: Secondary | ICD-10-CM

## 2024-10-20 DIAGNOSIS — I10 Essential (primary) hypertension: Secondary | ICD-10-CM | POA: Diagnosis not present

## 2024-10-20 MED ORDER — ROSUVASTATIN CALCIUM 40 MG PO TABS: 40.0000 mg | ORAL_TABLET | Freq: Every day | ORAL | 1 refills | Status: AC

## 2024-10-20 NOTE — Progress Notes (Signed)
 Established Patient Office Visit  Subjective   Patient ID: Alejandra Alexander, female    DOB: 1940-12-29  Age: 83 y.o. MRN: [id number]  Chief Complaint  Patient presents with   Medical Management of Chronic Issues    HPI  History of Present Illness   Alejandra Alexander is an 83 year old female with hypertension and hyperlipidemia who presents for a follow-up visit to review lab results and medication management.  Hypertension and antihypertensive therapy - Home blood pressure readings are generally stable, with systolic readings around 130 mmHg and diastolic readings below 90 mmHg. - Experiences frequent urination, particularly nocturia, attributed to current antihypertensive medication. - No urinary urgency or incontinence. - Denies HA, SOB, vision changes, edema, or CP.  Thyroid  function and levothyroxine  therapy - Thyroid  function tests show a slight increase. - Continues current levothyroxine  regimen. - Takes levothyroxine  in the morning and waits at least one hour before eating.  Hyperlipidemia and statin therapy - Cholesterol levels have increased significantly since the last check. - Currently taking rosuvastatin  but is unsure if any doses have been missed.  Glycemic control - A1c level is 5.8.  Vitamin d  deficiency - Vitamin D  levels are slightly low.  Immunization status - Uncertain if flu vaccine was received this year. - Due for tetanus vaccine. - History of chickenpox as a child, increasing risk for shingles, but not interested in receiving shingles vaccine.          ROS Per HPI.    Objective:     BP (!) 130/90   Pulse 83   Temp 97.7 F (36.5 C) (Oral)   Ht 5' 2 (1.575 m)   Wt 135 lb 1.9 oz (61.3 kg)   SpO2 98%   BMI 24.71 kg/m    Physical Exam Constitutional:      General: She is not in acute distress.    Appearance: Normal appearance.  Cardiovascular:     Rate and Rhythm: Normal rate and regular rhythm.     Heart sounds: Normal heart  sounds. No murmur heard.    No friction rub. No gallop.  Pulmonary:     Effort: Pulmonary effort is normal. No respiratory distress.     Breath sounds: Normal breath sounds.  Musculoskeletal:        General: No swelling.     Cervical back: Neck supple.  Lymphadenopathy:     Cervical: No cervical adenopathy.  Skin:    General: Skin is warm and dry.  Neurological:     General: No focal deficit present.     Mental Status: She is alert.  Psychiatric:        Mood and Affect: Mood normal.        Behavior: Behavior normal.        Thought Content: Thought content normal.      No results found for any visits on 10/20/24.  Last CBC Lab Results  Component Value Date   WBC 6.0 10/13/2024   HGB 14.5 10/13/2024   HCT 44.1 10/13/2024   MCV 91 10/13/2024   MCH 30.0 10/13/2024   RDW 13.1 10/13/2024   PLT 285 10/13/2024   Last metabolic panel Lab Results  Component Value Date   GLUCOSE 103 (H) 10/13/2024   NA 142 10/13/2024   K 4.2 10/13/2024   CL 105 10/13/2024   CO2 20 10/13/2024   BUN 17 10/13/2024   CREATININE 1.00 10/13/2024   EGFR 56 (L) 10/13/2024   CALCIUM  10.0 10/13/2024   PROT  7.0 10/13/2024   ALBUMIN 4.6 10/13/2024   LABGLOB 2.4 10/13/2024   AGRATIO 2.0 08/14/2022   BILITOT 0.7 10/13/2024   ALKPHOS 103 10/13/2024   AST 16 10/13/2024   ALT 14 10/13/2024   Last lipids Lab Results  Component Value Date   CHOL 266 (H) 10/13/2024   HDL 53 10/13/2024   LDLCALC 187 (H) 10/13/2024   TRIG 143 10/13/2024   CHOLHDL 5.0 (H) 10/13/2024   Last hemoglobin A1c Lab Results  Component Value Date   HGBA1C 5.8 (H) 10/13/2024   Last thyroid  functions Lab Results  Component Value Date   TSH 7.440 (H) 10/13/2024   T4TOTAL 6.0 10/13/2024   FREET4 1.10 01/03/2024   Last vitamin D  Lab Results  Component Value Date   VD25OH 22.0 (L) 10/13/2024      The ASCVD Risk score (Arnett DK, et al., 2019) failed to calculate for the following reasons:   The 2019 ASCVD risk  score is only valid for ages 6 to 56   * - Cholesterol units were assumed    Assessment & Plan:   Primary hypertension Assessment & Plan: BP goal < 130/80. Elevated blood pressure in office, improved but still above goal on recheck.  - Continue valsartan -hydrochlorothiazide  and amlodipine  10 mg daily. Given that BP is well controlled at home, will wait before making additional changes to medication. If additional agent is needed, consider spironolactone vs low dose of Metoprolol XL.  -Advised to continue home BP monitoring and notify of readings persistently > 130/80. Pt verbalized understanding and was in agreement with the plan.    Mixed hyperlipidemia Assessment & Plan: Last lipid panel: LDL 187, HDL 53, Trig 143. LDL significantly increased despite reported adherence to rosvuastatin.  Will increase dose from 20 mg to 40 mg daily. If LDL not improving at next OV, will add Zetia and re-evaluate.    Hypothyroidism, unspecified type Assessment & Plan: TSH mildly elevated at 7.440. Denies symptoms. Questionable adherence to levothyroxine  being taken away from other foods and medications. Advised to take medication at 7 am and wait at least 2 hours before taking any other medications or food. Will recheck thyroid  fx in 10-11 weeks. Continue levothyroxine  50 mcg for now. Patient verbalized understanding and was in agreement with the plan.    Vitamin D  deficiency Assessment & Plan: Vitamin D  levels slightly low, no current supplementation. - Start vitamin D  supplementation.   Chronic kidney disease, stage 3a (HCC) Assessment & Plan: EGFR 56, creatinine 1.00. Essentially at baseline. Discussed importance of good HTN control to prevent worsening. Advised 4-6 glasses of water or more per day. Will cont to monitor.    Other orders -     Rosuvastatin  Calcium ; Take 1 tablet (40 mg total) by mouth daily.  Dispense: 90 tablet; Refill: 1      Return in about 3 months (around 01/18/2025)  for HTN, HLD, thyroid .    Saddie JULIANNA Sacks, PA-C

## 2024-10-20 NOTE — Assessment & Plan Note (Addendum)
 BP goal < 130/80. Elevated blood pressure in office, improved but still above goal on recheck.  - Continue valsartan -hydrochlorothiazide  and amlodipine  10 mg daily. Given that BP is well controlled at home, will wait before making additional changes to medication. If additional agent is needed, consider spironolactone vs low dose of Metoprolol XL.  -Advised to continue home BP monitoring and notify of readings persistently > 130/80. Pt verbalized understanding and was in agreement with the plan.

## 2024-10-20 NOTE — Patient Instructions (Addendum)
 VISIT SUMMARY: Today we reviewed your lab results and discussed your current medications. We made some adjustments to your treatment plan to better manage your conditions.  YOUR PLAN: HYPERLIPIDEMIA: Your cholesterol levels have increased. -Increase rosuvastatin  to 40 mg daily. I have sent in the increased dose to [facility].  -We will recheck your cholesterol in 3 months to make sure the medication is working  HYPERTENSION: Your blood pressure is well controlled at home.  -Continue current medication. -Limit fluid intake after 7 PM.  HYPOTHYROIDISM: Your thyroid  levels have slightly increased. -Continue current levothyroxine  dosage but please take this medication at 7 am and wait at least 2 hours before eating or taking any of your other medications.   CHRONIC KIDNEY DISEASE: Your kidney function is stable, but you need to drink more water. -Increase water intake to 4-6 glasses daily. -Limit fluid intake after 7 PM.  VITAMIN D  DEFICIENCY: Your vitamin D  levels are slightly low. -Start vitamin D  supplementation.  GENERAL HEALTH MAINTENANCE: You are due for some vaccines. -Obtain tetanus vaccine from [facility]. -Consider shingles vaccine if desired.  If you have any problems before your next visit feel free to message me via MyChart (minor issues or questions) or call the office, otherwise you may reach out to schedule an office visit.  Thank you! Saddie Sacks, PA-C

## 2024-10-20 NOTE — Assessment & Plan Note (Signed)
 TSH mildly elevated at 7.440. Denies symptoms. Questionable adherence to levothyroxine  being taken away from other foods and medications. Advised to take medication at 7 am and wait at least 2 hours before taking any other medications or food. Will recheck thyroid  fx in 10-11 weeks. Continue levothyroxine  50 mcg for now. Patient verbalized understanding and was in agreement with the plan.

## 2024-10-20 NOTE — Assessment & Plan Note (Signed)
 Last lipid panel: LDL 187, HDL 53, Trig 143. LDL significantly increased despite reported adherence to rosvuastatin.  Will increase dose from 20 mg to 40 mg daily. If LDL not improving at next OV, will add Zetia and re-evaluate.

## 2024-10-20 NOTE — Assessment & Plan Note (Signed)
 EGFR 56, creatinine 1.00. Essentially at baseline. Discussed importance of good HTN control to prevent worsening. Advised 4-6 glasses of water or more per day. Will cont to monitor.

## 2024-10-20 NOTE — Assessment & Plan Note (Signed)
 Vitamin D  levels slightly low, no current supplementation. - Start vitamin D  supplementation.

## 2024-10-27 ENCOUNTER — Other Ambulatory Visit: Payer: Self-pay

## 2024-10-27 DIAGNOSIS — E039 Hypothyroidism, unspecified: Secondary | ICD-10-CM

## 2025-01-12 ENCOUNTER — Other Ambulatory Visit

## 2025-01-19 ENCOUNTER — Ambulatory Visit
